# Patient Record
Sex: Female | Born: 1970 | Race: Black or African American | Hispanic: No | Marital: Single | Smoking: Never smoker
Health system: Southern US, Community
[De-identification: ages and names within clinical notes are randomized; demographics above are authoritative.]

## PROBLEM LIST (undated history)

## (undated) DIAGNOSIS — Z8719 Personal history of other diseases of the digestive system: Secondary | ICD-10-CM

## (undated) HISTORY — PX: OTHER SURGICAL HISTORY: SHX169

## (undated) HISTORY — PX: HERNIA REPAIR: SHX51

---

## 1998-08-13 ENCOUNTER — Inpatient Hospital Stay (HOSPITAL_COMMUNITY): Admission: RE | Admit: 1998-08-13 | Discharge: 1998-08-13 | Payer: Self-pay | Admitting: Obstetrics

## 1998-08-13 ENCOUNTER — Encounter: Payer: Self-pay | Admitting: Obstetrics

## 1998-08-19 ENCOUNTER — Encounter (HOSPITAL_COMMUNITY): Admission: RE | Admit: 1998-08-19 | Discharge: 1998-09-12 | Payer: Self-pay | Admitting: *Deleted

## 1998-09-03 ENCOUNTER — Inpatient Hospital Stay (HOSPITAL_COMMUNITY): Admission: AD | Admit: 1998-09-03 | Discharge: 1998-09-03 | Payer: Self-pay | Admitting: Obstetrics

## 1998-09-11 ENCOUNTER — Inpatient Hospital Stay (HOSPITAL_COMMUNITY): Admission: AD | Admit: 1998-09-11 | Discharge: 1998-09-14 | Payer: Self-pay | Admitting: Obstetrics & Gynecology

## 2000-12-06 ENCOUNTER — Emergency Department (HOSPITAL_COMMUNITY): Admission: EM | Admit: 2000-12-06 | Discharge: 2000-12-06 | Payer: Self-pay

## 2014-12-06 ENCOUNTER — Emergency Department (HOSPITAL_COMMUNITY)
Admission: EM | Admit: 2014-12-06 | Discharge: 2014-12-06 | Disposition: A | Discharge status: Home / Self Care | Payer: BLUE CROSS/BLUE SHIELD | Source: Home / Self Care | Attending: Family Medicine | Admitting: Family Medicine

## 2014-12-06 ENCOUNTER — Encounter (HOSPITAL_COMMUNITY): Payer: Self-pay | Admitting: Emergency Medicine

## 2014-12-06 DIAGNOSIS — J3489 Other specified disorders of nose and nasal sinuses: Secondary | ICD-10-CM

## 2014-12-06 MED ORDER — MUPIROCIN CALCIUM 2 % NA OINT: TOPICAL_OINTMENT | NASAL | Status: DC

## 2014-12-06 MED ORDER — DOXYCYCLINE HYCLATE 100 MG PO CAPS: 100.0000 mg | ORAL_CAPSULE | Freq: Two times a day (BID) | ORAL | Status: DC

## 2014-12-06 NOTE — ED Provider Notes (Signed)
CSN: [id number]     Arrival date & time 12/06/14  1841 History   First MD Initiated Contact with Patient 12/06/14 1918     Chief Complaint  Patient presents with  . Facial Swelling   (Consider location/radiation/quality/duration/timing/severity/associated sxs/prior Treatment) HPI Comments: 44 year old female complaining of a painful bump in the left nostril. It started yesterday. She states it is very tender and the pain radiates to the paranasal area of the left side of the face. She attempted to protect this lesion with a needle yesterday without any apparent benefit.   History reviewed. No pertinent past medical history. History reviewed. No pertinent past surgical history. No family history on file. History  Substance Use Topics  . Smoking status: Never Smoker   . Smokeless tobacco: Not on file  . Alcohol Use: No   OB History    No data available     Review of Systems  Constitutional: Negative.   HENT: Negative for congestion, ear discharge, ear pain, postnasal drip, rhinorrhea and sore throat.   Eyes: Negative.   All other systems reviewed and are negative.   Allergies  Review of patient's allergies indicates no known allergies.  Home Medications   Prior to Admission medications   Medication Sig Start Date End Date Taking? Authorizing Provider  doxycycline (VIBRAMYCIN) 100 MG capsule Take 1 capsule (100 mg total) by mouth 2 (two) times daily. 12/06/14   Hayden Rasmussenavid Louvina Cleary, NP  mupirocin nasal ointment (BACTROBAN) 2 % Apply in each nostril daily 12/06/14   Hayden Rasmussenavid Shantai Tiedeman, NP   BP 124/69 mmHg  Pulse 77  Temp(Src) 98.2 F (36.8 C) (Oral)  Resp 16  SpO2 100%  LMP 11/17/2014 Physical Exam  Constitutional: She is oriented to person, place, and time. She appears well-developed and well-nourished. No distress.  HENT:  Mouth/Throat: No oropharyngeal exudate.  Tender papule in the left nostril just opposite the distal ala. No evidence of drainage or bleeding. Positive for tenderness.  I am not able to appreciate any significant facial swelling.  Neck: Normal range of motion. Neck supple.  Couple of anterior nontender cervical nodes.  Cardiovascular: Normal rate.   Pulmonary/Chest: Effort normal. No respiratory distress.  Neurological: She is alert and oriented to person, place, and time. She exhibits normal muscle tone.  Skin: Skin is warm.  Nursing note and vitals reviewed.   ED Course  Procedures (including critical care time) Labs Review Labs Reviewed - No data to display  Imaging Review No results found.   MDM   1. Nostril infection    Mupirocin nasal oint Doxy Topical anesthetic OTC Motrin or APAP    Hayden Rasmussenavid Bryella Diviney, NP 12/06/14 2001

## 2014-12-06 NOTE — Discharge Instructions (Signed)
Use the nasal cream as directed Doxycycline as directed. May use topical pain reliever as discussed Motrin or tylenol for pain.

## 2014-12-06 NOTE — ED Notes (Signed)
Patient c/o nasal swelling and pain onset yesterday. Patient reports she also feels like her lymph nodes are swelling. Patient denies any injury. She thinks maybe a spider bit her.Patient is in NAD.

## 2015-01-29 ENCOUNTER — Emergency Department (INDEPENDENT_AMBULATORY_CARE_PROVIDER_SITE_OTHER)
Admission: EM | Admit: 2015-01-29 | Discharge: 2015-01-29 | Disposition: A | Discharge status: Home / Self Care | Payer: BLUE CROSS/BLUE SHIELD | Source: Home / Self Care | Attending: Family Medicine | Admitting: Family Medicine

## 2015-01-29 ENCOUNTER — Encounter (HOSPITAL_COMMUNITY): Payer: Self-pay | Admitting: Emergency Medicine

## 2015-01-29 DIAGNOSIS — L732 Hidradenitis suppurativa: Secondary | ICD-10-CM | POA: Diagnosis not present

## 2015-01-29 MED ORDER — DOXYCYCLINE HYCLATE 100 MG PO CAPS: 100.0000 mg | ORAL_CAPSULE | Freq: Two times a day (BID) | ORAL | Status: DC

## 2015-01-29 MED ORDER — HYDROCODONE-ACETAMINOPHEN 5-325 MG PO TABS: 2.0000 | ORAL_TABLET | ORAL | Status: DC | PRN

## 2015-01-29 NOTE — Discharge Instructions (Signed)
Hidradenitis Suppurativa, Sweat Gland Abscess Hidradenitis suppurativa is a long lasting (chronic), uncommon disease of the sweat glands. With this, boil-like lumps and scarring develop in the groin, some times under the arms (axillae), and under the breasts. It may also uncommonly occur behind the ears, in the crease of the buttocks, and around the genitals.  CAUSES  The cause is from a blocking of the sweat glands. They then become infected. It may cause drainage and odor. It is not contagious. So it cannot be given to someone else. It most often shows up in puberty (about 10 to 43 years of age). But it may happen much later. It is similar to acne which is a disease of the sweat glands. This condition is slightly more common in African-Americans and women. SYMPTOMS   Hidradenitis usually starts as one or more red, tender, swellings in the groin or under the arms (axilla).  Over a period of hours to days the lesions get larger. They often open to the skin surface, draining clear to yellow-colored fluid.  The infected area heals with scarring. DIAGNOSIS  Your caregiver makes this diagnosis by looking at you. Sometimes cultures (growing germs on plates in the lab) may be taken. This is to see what germ (bacterium) is causing the infection.  TREATMENT   Topical germ killing medicine applied to the skin (antibiotics) are the treatment of choice. Antibiotics taken by mouth (systemic) are sometimes needed when the condition is getting worse or is severe.  Avoid tight-fitting clothing which traps moisture in.  Dirt does not cause hidradenitis and it is not caused by poor hygiene.  Involved areas should be cleaned daily using an antibacterial soap. Some patients find that the liquid form of Lever 2000, applied to the involved areas as a lotion after bathing, can help reduce the odor related to this condition.  Sometimes surgery is needed to drain infected areas or remove scarred tissue. Removal of  large amounts of tissue is used only in severe cases.  Birth control pills may be helpful.  Oral retinoids (vitamin A derivatives) for 6 to 12 months which are effective for acne may also help this condition.  Weight loss will improve but not cure hidradenitis. It is made worse by being overweight. But the condition is not caused by being overweight.  This condition is more common in people who have had acne.  It may become worse under stress. There is no medical cure for hidradenitis. It can be controlled, but not cured. The condition usually continues for years with periods of getting worse and getting better (remission). Document Released: 02/17/2004 Document Revised: 09/27/2011 Document Reviewed: 10/05/2013 Exit[employer] Patient Information 2015 [employer]. This information is not intended to replace advice given to you by your health care provider. Make sure you discuss any questions you have with your health care provider. Abscess An abscess is an infected area that contains a collection of pus and debris.It can occur in almost any part of the body. An abscess is also known as a furuncle or boil. CAUSES  An abscess occurs when tissue gets infected. This can occur from blockage of oil or sweat glands, infection of hair follicles, or a minor injury to the skin. As the body tries to fight the infection, pus collects in the area and creates pressure under the skin. This pressure causes pain. People with weakened immune systems have difficulty fighting infections and get certain abscesses more often.  SYMPTOMS Usually an abscess develops on the skin and   becomes a painful mass that is red, warm, and tender. If the abscess forms under the skin, you may feel a moveable soft area under the skin. Some abscesses break open (rupture) on their own, but most will continue to get worse without care. The infection can spread deeper into the body and eventually into the bloodstream, causing you to feel ill.   DIAGNOSIS  Your caregiver will take your medical history and perform a physical exam. A sample of fluid may also be taken from the abscess to determine what is causing your infection. TREATMENT  Your caregiver may prescribe antibiotic medicines to fight the infection. However, taking antibiotics alone usually does not cure an abscess. Your caregiver may need to make a small cut (incision) in the abscess to drain the pus. In some cases, gauze is packed into the abscess to reduce pain and to continue draining the area. HOME CARE INSTRUCTIONS   Only take over-the-counter or prescription medicines for pain, discomfort, or fever as directed by your caregiver.  If you were prescribed antibiotics, take them as directed. Finish them even if you start to feel better.  If gauze is used, follow your caregiver's directions for changing the gauze.  To avoid spreading the infection:  Keep your draining abscess covered with a bandage.  Wash your hands well.  Do not share personal care items, towels, or whirlpools with others.  Avoid skin contact with others.  Keep your skin and clothes clean around the abscess.  Keep all follow-up appointments as directed by your caregiver. SEEK MEDICAL CARE IF:   You have increased pain, swelling, redness, fluid drainage, or bleeding.  You have muscle aches, chills, or a general ill feeling.  You have a fever. MAKE SURE YOU:   Understand these instructions.  Will watch your condition.  Will get help right away if you are not doing well or get worse. Document Released: 04/14/2005 Document Revised: 01/04/2012 Document Reviewed: 09/17/2011 ExitCare Patient Information 2015 [employer]. This information is not intended to replace advice given to you by your health care provider. Make sure you discuss any questions you have with your health care provider.  

## 2015-01-29 NOTE — ED Notes (Signed)
At bedside for i/d of abscess of left axilla

## 2015-01-29 NOTE — ED Notes (Signed)
Abscess to left axilla, no history of this before.

## 2015-01-29 NOTE — ED Provider Notes (Signed)
CSN: [id number]     Arrival date & time 01/29/15  1709 History   First MD Initiated Contact with Patient 01/29/15 1742     Chief Complaint  Patient presents with  . Abscess   (Consider location/radiation/quality/duration/timing/severity/associated sxs/prior Treatment) Patient is a 44 y.o. female presenting with abscess. The history is provided by the patient. No language interpreter was used.  Abscess Location:  Shoulder/arm Shoulder/arm abscess location:  L axilla Size:  3cm Abscess quality: draining, painful, redness and warmth   Red streaking: no   Progression:  Worsening Pain details:    Quality:  Aching   Severity:  Moderate   Progression:  Worsening Chronicity:  New Relieved by:  Nothing Ineffective treatments:  None tried Pt reports she stuck area this am and drained infection.   Pt has 3 large lumps.   History reviewed. No pertinent past medical history. History reviewed. No pertinent past surgical history. No family history on file. History  Substance Use Topics  . Smoking status: Never Smoker   . Smokeless tobacco: Not on file  . Alcohol Use: No   OB History    No data available     Review of Systems  Skin: Positive for wound.  All other systems reviewed and are negative.   Allergies  Review of patient's allergies indicates no known allergies.  Home Medications   Prior to Admission medications   Medication Sig Start Date End Date Taking? Authorizing Provider  doxycycline (VIBRAMYCIN) 100 MG capsule Take 1 capsule (100 mg total) by mouth 2 (two) times daily. 01/29/15   Elson AreasLeslie K Oluwaferanmi Wain, PA-C  HYDROcodone-acetaminophen (NORCO/VICODIN) 5-325 MG per tablet Take 2 tablets by mouth every 4 (four) hours as needed. 01/29/15   Elson AreasLeslie K Sahaana Weitman, PA-C  mupirocin nasal ointment (BACTROBAN) 2 % Apply in each nostril daily Patient not taking: Reported on 01/29/2015 12/06/14   Hayden Rasmussenavid Mabe, NP   BP 104/64 mmHg  Pulse 89  Temp(Src) 97.9 F (36.6 C) (Oral)  Resp 20  SpO2  98%  LMP 01/14/2015 Physical Exam  Constitutional: She appears well-developed and well-nourished.  HENT:  Head: Normocephalic.  Eyes: Pupils are equal, round, and reactive to light.  Abdominal: Soft.  Musculoskeletal: She exhibits tenderness.  3 swollen areas left axilla  One open draining area,  No fluctuance  Neurological: She is alert.  Skin: Skin is warm.  Psychiatric: She has a normal mood and affect.    ED Course  INCISION AND DRAINAGE Date/Time: 01/29/2015 6:54 PM Performed by: Elson AreasSOFIA, Nakima Fluegge K Authorized by: Bradd CanaryKINDL, JAMES D Consent: Verbal consent not obtained. Risks and benefits: risks, benefits and alternatives were discussed Consent given by: patient Required items: required blood products, implants, devices, and special equipment available Type: abscess Body area: upper extremity Anesthesia: local infiltration Local anesthetic: lidocaine 2% without epinephrine Scalpel size: 11 Incision type: single straight Complexity: simple Drainage: bloody Wound treatment: drain placed Patient tolerance: Patient tolerated the procedure well with no immediate complications Comments: Pt wanted areas opened.   I made small incision in all three areas.  1st area is open ahnd has drained.  No drainage from other areas.   (including critical care time) Labs Review Labs Reviewed - No data to display  Imaging Review No results found.   MDM   1. Hidradenitis suppurativa    avs Hydrocodone Doxycycline    Elson AreasLeslie K Vernella Niznik, PA-C 01/29/15 1856

## 2016-05-13 ENCOUNTER — Ambulatory Visit (HOSPITAL_COMMUNITY)
Admission: EM | Admit: 2016-05-13 | Discharge: 2016-05-13 | Disposition: A | Discharge status: Home / Self Care | Payer: Self-pay | Attending: Internal Medicine | Admitting: Internal Medicine

## 2016-05-13 ENCOUNTER — Encounter (HOSPITAL_COMMUNITY): Payer: Self-pay | Admitting: Emergency Medicine

## 2016-05-13 DIAGNOSIS — S86819A Strain of other muscle(s) and tendon(s) at lower leg level, unspecified leg, initial encounter: Secondary | ICD-10-CM

## 2016-05-13 DIAGNOSIS — M7052 Other bursitis of knee, left knee: Secondary | ICD-10-CM

## 2016-05-13 DIAGNOSIS — M7652 Patellar tendinitis, left knee: Secondary | ICD-10-CM

## 2016-05-13 MED ORDER — NAPROXEN 375 MG PO TABS: 375.0000 mg | ORAL_TABLET | Freq: Two times a day (BID) | ORAL | 0 refills | Status: DC

## 2016-05-13 NOTE — ED Provider Notes (Signed)
CSN: [id number]     Arrival date & time 05/13/16  1951 History   First MD Initiated Contact with Patient 05/13/16 2105     Chief Complaint  Patient presents with  . Leg Pain   (Consider location/radiation/quality/duration/timing/severity/associated sxs/prior Treatment) 45 year old obese female complaining of pain to the anterior knee and to the proximal medial left calf for approximately 8 days. Gradual onset. She states she is on her feet for 14 hours a day. She has 2 jobs that she has not had many days off in the past several weeks. The onset was rather gradual. She has discomfort while working however the greatest discomfort is after sitting for a while or in the morning after sleeping when she has much stiffness in these areas and pain when initiating weightbearing with ambulation. This gradually gets better the more walking that she does. The muscle pain is located to the proximal medial gastrocnemius. The joint pain is located to the anterior tibia along the patellar ligament and bursa. She denies swelling. Denies a period of immobility. Denies injury, blunt trauma. No fevers or chills.      History reviewed. No pertinent past medical history. History reviewed. No pertinent surgical history. History reviewed. No pertinent family history. Social History  Substance Use Topics  . Smoking status: Never Smoker  . Smokeless tobacco: Never Used  . Alcohol use No   OB History    No data available     Review of Systems  Constitutional: Negative.  Negative for activity change, chills and fever.  HENT: Negative.   Respiratory: Negative.   Cardiovascular: Negative.   Musculoskeletal: Positive for myalgias. Negative for back pain, joint swelling and neck pain.       As per HPI  Skin: Negative for color change, pallor and rash.  Neurological: Negative.   All other systems reviewed and are negative.   Allergies  Review of patient's allergies indicates no known allergies.  Home  Medications   Prior to Admission medications   Medication Sig Start Date End Date Taking? Authorizing Provider  naproxen (NAPROSYN) 375 MG tablet Take 1 tablet (375 mg total) by mouth 2 (two) times daily. 05/13/16   Hayden Rasmussen, NP   Meds Ordered and Administered this Visit  Medications - No data to display  BP 121/62 (BP Location: Left Arm)   Pulse 79   Temp 97.9 F (36.6 C) (Oral)   Resp 18   LMP 04/17/2016   SpO2 100%  No data found.   Physical Exam  Constitutional: She is oriented to person, place, and time. She appears well-developed and well-nourished. No distress.  HENT:  Head: Normocephalic and atraumatic.  Eyes: EOM are normal.  Neck: Normal range of motion. Neck supple.  Cardiovascular: Normal rate.   Pulmonary/Chest: Effort normal.  Musculoskeletal: Normal range of motion. She exhibits no edema or deformity.  There is tenderness to the anterior knee along the patellar ligament and tibial tuberosity. There is a small area of swelling to this area as well. Tenderness to the proximal medial calf muscle in a small area. There is no swelling or tension to this area. Palpates as soft. No erythema or increase in warmth. No signs of infection. Full range of motion of the knee. There is no laxity. Negative drawer, negative valgus. No pain with internal/external rotation. No swelling or tenderness to the popliteal fossa. Posterior tibialis muscle is 2+. Normal warmth and color. No skin lesions or cyanosis.  Lymphadenopathy:    She has no cervical adenopathy.  Neurological: She is alert and oriented to person, place, and time. No cranial nerve deficit. Coordination normal.  Skin: Skin is warm and dry. Capillary refill takes less than 2 seconds.  Psychiatric: She has a normal mood and affect.  Nursing note and vitals reviewed.   Urgent Care Course   Clinical Course    Procedures (including critical care time)  Labs Review Labs Reviewed - No data to display  Imaging  Review No results found.   Visual Acuity Review  Right Eye Distance:   Left Eye Distance:   Bilateral Distance:    Right Eye Near:   Left Eye Near:    Bilateral Near:         MDM   1. Patellar tendinitis of left knee   2. Patellar bursitis of left knee   3. Strain of calf muscle, initial encounter    Place ice on the front of the knee and heat to the calf muscle. Perform the stretches for the calf muscles 3-4 times a day as demonstrated, wear the compression stockings while standing and working and walking, take the Naprosyn for inflammation and pain, elevate your legs periodically. For swelling and increased pain in the calf muscle seek medical attention promptly as that might be a sign of a blood clot. Meds ordered this encounter  Medications  . naproxen (NAPROSYN) 375 MG tablet    Sig: Take 1 tablet (375 mg total) by mouth 2 (two) times daily.    Dispense:  20 tablet    Refill:  0    Order Specific Question:   Supervising Provider    Answer:   Oren BeckmannGRUNZ, RYAN B [[id number]]       Gerhard Rappaport, NP 05/13/16 2141

## 2016-05-13 NOTE — ED Triage Notes (Signed)
Pt c/o LLE pain onset 8 days associated swelling, tender, localized fever  Pain increases when walking   Reports she fell 3 months ago at work left knee  A&O x4... NAD

## 2016-05-13 NOTE — Discharge Instructions (Signed)
Place ice on the front of the knee and heat to the calf muscle. Perform the stretches for the calf muscles 3-4 times a day as demonstrated, wear the compression stockings while standing and working and walking, take the Naprosyn for inflammation and pain, elevate your legs periodically. For swelling and increased pain in the calf muscle seek medical attention promptly as that might be a sign of a blood clot.

## 2017-03-07 ENCOUNTER — Encounter (HOSPITAL_COMMUNITY): Payer: Self-pay | Admitting: Emergency Medicine

## 2017-03-07 ENCOUNTER — Ambulatory Visit (HOSPITAL_COMMUNITY)
Admission: EM | Admit: 2017-03-07 | Discharge: 2017-03-07 | Disposition: A | Discharge status: Home / Self Care | Payer: Managed Care, Other (non HMO) | Attending: Family Medicine | Admitting: Family Medicine

## 2017-03-07 DIAGNOSIS — T148XXA Other injury of unspecified body region, initial encounter: Secondary | ICD-10-CM

## 2017-03-07 NOTE — Discharge Instructions (Signed)
You can take ibuprofen 600-800mg  three times a day or naproxen 440mg  two times a day. Follow up with PCP to rule out DVTs given family history of blood clots. If experiencing chest pain, shortness of breath, weakness, dizziness, go to the emergency department for further evaluation.

## 2017-03-07 NOTE — ED Provider Notes (Signed)
[facility]    CSN: [id number] Arrival date & time: 03/07/17  1944     History   Chief Complaint Chief Complaint  Patient presents with  . Arm Pain  . Chest Pain    HPI Brandylee Bertelli is a 46 y.o. female.   46 year old female without significant medical history comes in for 1 week history of multiple symptoms. Patient has had multiple family members diagnosed/treated for "blood clots" in the past few months, few of which had pulmonary embolisms. Patient comes in with complaints of right upper extremity, left upper extremity pain, chest pain, palpitations, tremors, numbness/tingling. When asked what symptoms start first, patient states "my family telling me they have blood clots". She was unable to specifically state what symptoms started first. Symptoms are not constant, and at different locations. Patient specifically denies any chest pain, shortness of breath, trouble breathing, palpitations, numbness/tingling at this visit. She denies diabetes, hyperlipidemia, smoking, long travels. Denies swelling of the leg, calf pain.       History reviewed. No pertinent past medical history.  There are no active problems to display for this patient.   Past Surgical History:  Procedure Laterality Date  . HERNIA REPAIR      OB History    No data available       Home Medications    Prior to Admission medications   Medication Sig Start Date End Date Taking? Authorizing Provider  naproxen (NAPROSYN) 375 MG tablet Take 1 tablet (375 mg total) by mouth 2 (two) times daily. 05/13/16   Hayden Rasmussen, NP    Family History No family history on file.  Social History Social History  Substance Use Topics  . Smoking status: Never Smoker  . Smokeless tobacco: Never Used  . Alcohol use No     Allergies   Patient has no known allergies.   Review of Systems Review of Systems  Reason unable to perform ROS: See HPI as above.     Physical Exam Triage Vital Signs ED  Triage Vitals  Enc Vitals Group     BP 03/07/17 2055 116/61     Pulse Rate 03/07/17 2055 70     Resp 03/07/17 2055 (!) 22     Temp 03/07/17 2055 98.1 F (36.7 C)     Temp Source 03/07/17 2055 Oral     SpO2 03/07/17 2055 100 %     Weight --      Height --      Head Circumference --      Peak Flow --      Pain Score 03/07/17 2052 5     Pain Loc --      Pain Edu? --      Excl. in GC? --    No data found.   Updated Vital Signs BP 116/61 (BP Location: Left Arm)   Pulse 70   Temp 98.1 F (36.7 C) (Oral)   Resp (!) 22   LMP 02/28/2017   SpO2 100%   Visual Acuity Right Eye Distance:   Left Eye Distance:   Bilateral Distance:    Right Eye Near:   Left Eye Near:    Bilateral Near:     Physical Exam  Constitutional: She is oriented to person, place, and time. She appears well-developed and well-nourished. No distress.  HENT:  Head: Normocephalic and atraumatic.  Neck: Neck supple. No spinous process tenderness and no muscular tenderness present. Normal range of motion present.  Cardiovascular: Normal rate, regular rhythm  and normal heart sounds.  Exam reveals no gallop and no friction rub.   No murmur heard. Pulmonary/Chest: Effort normal and breath sounds normal. No respiratory distress. She has no wheezes. She has no rales.  Musculoskeletal:  No swelling of the upper extremities, no palpable cords, increased warmth/erythema noted. No pain on palpation of the shoulders, back, upper extremities. Full ROM of shoulders, elbows, wrists, fingers. Strength normal and equal bilaterally. Sensation intact and equal bilaterally. Radial pulses 2+ and equal. Unable to assess cap refill due to nail polish.   No swelling noted of lower extremity. Negative homans.   Neurological: She is alert and oriented to person, place, and time.     UC Treatments / Results  Labs (all labs ordered are listed, but only abnormal results are displayed) Labs Reviewed - No data to display  EKG  EKG  Interpretation None       Radiology No results found.  Procedures Procedures (including critical care time)  Medications Ordered in UC Medications - No data to display   Initial Impression / Assessment and Plan / UC Course  I have reviewed the triage vital signs and the nursing notes.  Pertinent labs & imaging results that were available during my care of the patient were reviewed by me and considered in my medical decision making (see chart for details).    Discussed with patient no alarming symptoms at this visit.  After discussing findings of visit today, patient states feeling much better with decreased symptoms. Given patient with upper extremity, with movement, will treat for muscle strain. Start naproxen and heat compresses. Patient to follow up with PCP for ultrasound to rule out DVT, blood disorders given multiple family member with blood clots. Return precautions given.   Final Clinical Impressions(s) / UC Diagnoses   Final diagnoses:  Muscle strain    New Prescriptions Discharge Medication List as of 03/07/2017  9:17 PM        Belinda Fisher, PA-C 03/07/17 2304

## 2017-03-07 NOTE — ED Triage Notes (Signed)
Right arm pain, tingling, palpitations, and left arm pain.  Pain is throbbing.  Denies sob Patient has had several family members with blood clots.

## 2017-05-31 ENCOUNTER — Other Ambulatory Visit (HOSPITAL_COMMUNITY): Payer: Self-pay | Admitting: General Surgery

## 2017-06-07 ENCOUNTER — Encounter: Payer: Managed Care, Other (non HMO) | Attending: General Surgery | Admitting: Registered"

## 2017-06-07 DIAGNOSIS — Z713 Dietary counseling and surveillance: Secondary | ICD-10-CM | POA: Diagnosis present

## 2017-06-07 DIAGNOSIS — M25562 Pain in left knee: Secondary | ICD-10-CM | POA: Diagnosis not present

## 2017-06-07 DIAGNOSIS — M25561 Pain in right knee: Secondary | ICD-10-CM | POA: Diagnosis not present

## 2017-06-07 DIAGNOSIS — Z8249 Family history of ischemic heart disease and other diseases of the circulatory system: Secondary | ICD-10-CM | POA: Diagnosis not present

## 2017-06-07 DIAGNOSIS — E669 Obesity, unspecified: Secondary | ICD-10-CM

## 2017-06-07 DIAGNOSIS — M545 Low back pain: Secondary | ICD-10-CM | POA: Diagnosis not present

## 2017-06-07 DIAGNOSIS — Z811 Family history of alcohol abuse and dependence: Secondary | ICD-10-CM | POA: Insufficient documentation

## 2017-06-07 NOTE — Progress Notes (Signed)
Pre-Op Assessment Visit:  Pre-Operative Sleeve Gastrectomy Surgery  Medical Nutrition Therapy:  Appt start time: 3:20  End time:  4:15  Patient was seen on 06/07/2017 for Pre-Operative Nutrition Assessment. Assessment and [facility] coordinator on 06/07/2017.   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 Pt works as Public house managerLPN. Pt states she works 16-20hrs/day; averages 80 hrs/week. Pt states she is currently applying to nursing school at [employer].   Per insurance, pt needs 3 SWL visits prior to surgery. Pt will verify with [facility] if previous visits with dietitian will count towards 3 months of supervised weight loss visits required by insurance.    24 hr Dietary Recall: First Meal: Belvita protein bar Snack: none Second Meal: fish sandwich, soup, fries Snack: none Third Meal: chicken, beans, green beans Snack: popcorn Beverages: coffee, cranberry/grape juice, flavored water  Encouraged to engage in 75 minutes of moderate physical activity including cardiovascular and weight baring weekly  Handouts given during visit include:  . Pre-Op Goals . Bariatric Surgery Protein Shakes . Vitamin and Mineral Recommendations  During the appointment today the following Pre-Op Goals were reviewed with the patient: . Track your food and beverage: MyFitnessPal or Baritastic App . Maintain or lose weight as instructed by your surgeon . Make healthy food choices . Begin to limit portion sizes . Limited concentrated sugars and fried foods . Keep fat/sugar in the single digits per serving on         food labels . Practice CHEWING your food  (aim for 30 chews per bite or until applesauce consistency) . Practice  not drinking 15 minutes before, during, and 30 minutes after each meal/snack . Avoid all carbonated beverages  . Avoid/limit caffeinated beverages  . Avoid all sugar-sweetened beverages . Consume 3 meals per day; eat every 3-5 hours . Make a list of non-food related activities . Aim for 64-100 ounces of FLUID daily  . Aim for at least 60-80 grams of PROTEIN daily . Look for a liquid protein source that contain ?15 g protein and ?5 g carbohydrate  (ex: shakes, drinks, shots) . Physical activity is an important part of a healthy lifestyle so keep it moving! - Verify with [facility] if previous visits with dietitian will count towards 3 months of supervised weight loss visits required by insurance.   Follow diet recommendations listed below Energy and Macronutrient Recommendations: Calories: 1800 Carbohydrate: 200 Protein: 135 Fat: 50  Demonstrated degree of understanding via:  Teach Back   Teaching Method Utilized:  Visual Auditory Hands on  Barriers to learning/adherence to lifestyle change: work-life balance  Patient to call the [facility] to enroll in Pre-Op and Post-Op Nutrition Education when surgery date is scheduled.

## 2017-06-07 NOTE — Patient Instructions (Addendum)
-   Verify with [facility] if previous visits with dietitian will count towards 3 months of supervised weight loss visits required by insurance.

## 2017-06-15 ENCOUNTER — Encounter (HOSPITAL_COMMUNITY): Payer: Self-pay

## 2017-06-15 ENCOUNTER — Other Ambulatory Visit (HOSPITAL_COMMUNITY): Payer: Self-pay | Admitting: General Surgery

## 2017-06-15 ENCOUNTER — Ambulatory Visit (HOSPITAL_COMMUNITY): Payer: Managed Care, Other (non HMO)

## 2017-06-27 ENCOUNTER — Ambulatory Visit: Payer: Managed Care, Other (non HMO)

## 2017-06-30 ENCOUNTER — Ambulatory Visit (HOSPITAL_COMMUNITY)
Admission: RE | Admit: 2017-06-30 | Discharge: 2017-06-30 | Disposition: A | Discharge status: Home / Self Care | Payer: Managed Care, Other (non HMO) | Source: Ambulatory Visit | Attending: General Surgery | Admitting: General Surgery

## 2017-06-30 ENCOUNTER — Encounter (HOSPITAL_COMMUNITY): Payer: Self-pay | Admitting: Radiology

## 2017-06-30 ENCOUNTER — Other Ambulatory Visit: Payer: Self-pay

## 2017-06-30 DIAGNOSIS — Z01810 Encounter for preprocedural cardiovascular examination: Secondary | ICD-10-CM | POA: Insufficient documentation

## 2017-06-30 DIAGNOSIS — Z01818 Encounter for other preprocedural examination: Secondary | ICD-10-CM | POA: Diagnosis present

## 2017-07-04 ENCOUNTER — Ambulatory Visit: Payer: Managed Care, Other (non HMO)

## 2017-07-18 ENCOUNTER — Ambulatory Visit: Payer: Managed Care, Other (non HMO)

## 2017-07-20 ENCOUNTER — Ambulatory Visit (HOSPITAL_COMMUNITY): Payer: Managed Care, Other (non HMO)

## 2017-07-20 ENCOUNTER — Other Ambulatory Visit (HOSPITAL_COMMUNITY): Payer: Managed Care, Other (non HMO)

## 2017-07-25 ENCOUNTER — Encounter: Payer: Managed Care, Other (non HMO) | Attending: General Surgery | Admitting: Skilled Nursing Facility1

## 2017-07-25 ENCOUNTER — Encounter: Payer: Self-pay | Admitting: Skilled Nursing Facility1

## 2017-07-25 DIAGNOSIS — M25561 Pain in right knee: Secondary | ICD-10-CM | POA: Insufficient documentation

## 2017-07-25 DIAGNOSIS — M545 Low back pain: Secondary | ICD-10-CM | POA: Diagnosis not present

## 2017-07-25 DIAGNOSIS — Z811 Family history of alcohol abuse and dependence: Secondary | ICD-10-CM | POA: Insufficient documentation

## 2017-07-25 DIAGNOSIS — E669 Obesity, unspecified: Secondary | ICD-10-CM

## 2017-07-25 DIAGNOSIS — M25562 Pain in left knee: Secondary | ICD-10-CM | POA: Diagnosis not present

## 2017-07-25 DIAGNOSIS — Z713 Dietary counseling and surveillance: Secondary | ICD-10-CM | POA: Insufficient documentation

## 2017-07-25 DIAGNOSIS — Z8249 Family history of ischemic heart disease and other diseases of the circulatory system: Secondary | ICD-10-CM | POA: Insufficient documentation

## 2017-07-25 NOTE — Progress Notes (Signed)
Pre-Operative Nutrition Class:  Appt start time: 8599   End time:  1830.  Patient was seen on 07/25/2017 for Pre-Operative Bariatric Surgery Education at [facility].   Surgery date: 08/08/2017 Surgery type: sleeve Start weight at [facility]: 254.8 Weight today: 261.4  Samples given per MNT protocol. Patient educated on appropriate usage: Procare Multivitamin Lot # [id number] Exp: 05/2019  Bariatric Advantage Calcium  Lot # [id number] Exp: [id number]  Renee Pain Protein Shake Lot # [id number] Exp: may-26-2019  The following the learning objectives were met by the patient during this course:  Identify Pre-Op Dietary Goals and will begin 2 weeks pre-operatively  Identify appropriate sources of fluids and proteins   State protein recommendations and appropriate sources pre and post-operatively  Identify Post-Operative Dietary Goals and will follow for 2 weeks post-operatively  Identify appropriate multivitamin and calcium sources  Describe the need for physical activity post-operatively and will follow MD recommendations  State when to call healthcare provider regarding medication questions or post-operative complications  Handouts given during class include:  Pre-Op Bariatric Surgery Diet Handout  Protein Shake Handout  Post-Op Bariatric Surgery Nutrition Handout  BELT Program Information Flyer  Support Group Information Flyer  [facility] Bariatric Supplements Price List  Follow-Up Plan: [facility] diet advancement per MD.

## 2017-07-27 ENCOUNTER — Ambulatory Visit: Payer: Self-pay | Admitting: General Surgery

## 2017-07-27 NOTE — H&P (Signed)
Shelby Mosley Documented: 07/27/2017 10:08 AM Location: [facility] Patient #: [id number] DOB: 10/15/1970 Single / Language: Shelby Mosley / Race: Black or African American Female  History of Present Illness Shelby Mosley M. Shelby Matsushima MD; 07/27/2017 11:12 AM) The patient is a 47 year old female who presents for a bariatric surgery evaluation. She comes in today for her preoperative appointment. I initially met her in November. Her weight at that time was 258 pounds. She has completed her workup and evaluation for weight loss surgery. She denies any medical changes since she was initially seen. She denies any heartburn or reflux. She denies any shortness of breath, chest pain, dyspnea on exertion, or angina. She is not smoking. Her preoperative upper GI showed a small hiatal hernia without evidence of reflux. Her bariatric evaluation labs were unremarkable except for hemoglobin A1c of 5.7. Her hemoglobin was slightly low at 11.2 but normal MCV.   NOVEMBER 2018 She comes in today to discuss weight loss surgery. She is referred by Dr. Jannette Mosley. She is interested in sleeve gastrectomy. She completed our Neurosurgeon. She is interested in improving her overall health and being able to move around easier. Her sister underwent a sleeve gastrectomy. Despite numerous attempts for sustained weight loss she is been unsuccessful. She has seen a dietitian for several months in a row, phentermine, and Adipex-all without any long-term success  She denies any chest pain, chest pressure, shortness of breath, orthopnea, paroxysmal nocturnal dyspnea, prior blood clots, peripheral edema. She denies any dyspnea on exertion. She denies any heartburn, indigestion reflux. She has a daily bowel movement. She has had an open umbilical hernia. Without mesh. She denies any abdominal pain. She has had a tubal ligation. She is a G4 P4. Her children are ages 41, 61, 27. She size regular menses. She has chronic low  back pain without sciatica. She also has bilateral knee pain as well as heel spurs. She denies any headaches or migraines. She denies any TIAs or amaurosis fugax. She denies any alcohol or drug use or tobacco use. She is in nursing school.   Problem List/Past Medical Shelby Mosley M. Redmond Pulling, MD; 07/27/2017 11:12 AM) BACK PAIN, LUMBOSACRAL (M54.5) BILATERAL CHRONIC KNEE PAIN (M25.561, M25.562) OBESITY, MORBID, BMI 40.0-49.9 (E66.01) PREDIABETES (R73.03) 5.7 (05/2017)  Past Surgical History Shelby Mosley M. Redmond Pulling, MD; 07/27/2017 11:12 AM) No pertinent past surgical history  Diagnostic Studies History Shelby Mosley M. Redmond Pulling, MD; 07/27/2017 11:12 AM) Colonoscopy never Mammogram >3 years ago Pap Smear 1-5 years ago  Allergies (Shelby Mosley, [place]; 07/27/2017 10:09 AM) No Known Drug Allergies [05/26/2017]: Allergies Reconciled  Medication History Shelby Mosley M. Redmond Pulling, MD; 07/27/2017 11:12 AM) Medications Reconciled OxyCODONE HCl (5MG/5ML Solution, 5-10 Oral every four hours, as needed, Taken starting 07/27/2017) Active. No Current Medications (Taken starting 07/27/2017) Protonix (40MG Tablet DR, 1 (one) Oral daily, Taken starting 07/27/2017) Active. Zofran (4MG Tablet, 1 (one) Oral every eight hours, as needed, Taken starting 07/27/2017) Active.  Social History Shelby Mosley M. Redmond Pulling, MD; 07/27/2017 11:12 AM) Caffeine use Coffee. No alcohol use No drug use Tobacco use Never smoker.  Family History Shelby Mosley M. Redmond Pulling, MD; 07/27/2017 11:12 AM) Alcohol Abuse Father. Hypertension Mother.  Pregnancy / Birth History Shelby Mosley M. Redmond Pulling, MD; 07/27/2017 11:12 AM) Age at menarche 12 years. Contraceptive History Contraceptive implant. Gravida 4 Maternal age 28-20 Para 4 Regular periods  Other Problems Shelby Mosley M. Redmond Pulling, MD; 12/23/5991 57:01 AM) Umbilical Hernia Repair     Review of Systems Shelby Mosley M. Shelby Bigos MD; 07/27/2017 11:09 AM) General Not Present- Appetite  Loss, Chills, Fatigue, Fever, Night Sweats,  Weight Gain and Weight Loss. Skin Not Present- Change in Wart/Mole, Dryness, Hives, Jaundice, New Lesions, Non-Healing Wounds, Rash and Ulcer. HEENT Present- Wears glasses/contact lenses. Not Present- Earache, Hearing Loss, Hoarseness, Nose Bleed, Oral Ulcers, Ringing in the Ears, Seasonal Allergies, Sinus Pain, Sore Throat, Visual Disturbances and Yellow Eyes. Respiratory Not Present- Bloody sputum, Chronic Cough, Difficulty Breathing, Snoring and Wheezing. Breast Not Present- Breast Mass, Breast Pain, Nipple Discharge and Skin Changes. Cardiovascular Not Present- Chest Pain, Difficulty Breathing Lying Down, Leg Cramps, Palpitations, Rapid Heart Rate, Shortness of Breath and Swelling of Extremities. Gastrointestinal Not Present- Abdominal Pain, Bloating, Bloody Stool, Change in Bowel Habits, Chronic diarrhea, Constipation, Difficulty Swallowing, Excessive gas, Gets full quickly at meals, Hemorrhoids, Indigestion, Nausea, Rectal Pain and Vomiting. Female Genitourinary Not Present- Frequency, Nocturia, Painful Urination, Pelvic Pain and Urgency. Musculoskeletal Not Present- Back Pain, Joint Pain, Joint Stiffness, Muscle Pain, Muscle Weakness and Swelling of Extremities. Neurological Not Present- Decreased Memory, Fainting, Headaches, Numbness, Seizures, Tingling, Tremor, Trouble walking and Weakness. Psychiatric Not Present- Anxiety, Bipolar, Change in Sleep Pattern, Depression, Fearful and Frequent crying. Endocrine Not Present- Cold Intolerance, Excessive Hunger, Hair Changes, Heat Intolerance, Hot flashes and New Diabetes. Hematology Not Present- Blood Thinners, Easy Bruising, Excessive bleeding, Gland problems, HIV and Persistent Infections.  Vitals (Shelby Mosley; 07/27/2017 10:09 AM) 07/27/2017 10:09 AM Weight: 262.5 lb Height: 67in Body Surface Area: 2.27 m Body Mass Index: 41.11 kg/m  Temp.: 98.58F  Pulse: 92 (Regular)  BP: 128/84 (Sitting, Left Arm,  Standard)      Physical Exam Shelby Mosley M. Shelby Bastedo MD; 07/27/2017 11:09 AM)  General Mental Status-Alert. General Appearance-Consistent with stated age. Hydration-Well hydrated. Voice-Normal.  Head and Neck Head-normocephalic, atraumatic with no lesions or palpable masses. Trachea-midline. Thyroid Gland Characteristics - normal size and consistency.  Eye Eyeball - Bilateral-Extraocular movements intact. Sclera/Conjunctiva - Bilateral-No scleral icterus.  ENMT Mouth and Throat -Note:lips intact.  Note: normal external ears  Chest and Lung Exam Chest and lung exam reveals -quiet, even and easy respiratory effort with no use of accessory muscles and on auscultation, normal breath sounds, no adventitious sounds and normal vocal resonance. Inspection Chest Wall - Normal. Back - normal.  Breast - Did not examine.  Cardiovascular Cardiovascular examination reveals -normal heart sounds, regular rate and rhythm with no murmurs and normal pedal pulses bilaterally.  Abdomen Inspection Inspection of the abdomen reveals - No Hernias. Skin - Scar - Note: small infraumbilical scar. Palpation/Percussion Palpation and Percussion of the abdomen reveal - Soft, Non Tender, No Rebound tenderness, No Rigidity (guarding) and No hepatosplenomegaly. Auscultation Auscultation of the abdomen reveals - Bowel sounds normal.  Peripheral Vascular Upper Extremity Palpation - Pulses bilaterally normal.  Neurologic Neurologic evaluation reveals -alert and oriented x 3 with no impairment of recent or remote memory. Mental Status-Normal.  Neuropsychiatric The patient's mood and affect are described as -normal. Judgment and Insight-insight is appropriate concerning matters relevant to self.  Musculoskeletal Normal Exam - Left-Upper Extremity Strength Normal and Lower Extremity Strength Normal. Normal Exam - Right-Upper Extremity Strength Normal and Lower Extremity  Strength Normal. Note: b/l knee crepitus  Lymphatic Head & Neck  General Head & Neck Lymphatics: Bilateral - Description - Normal. Axillary - Did not examine. Femoral & Inguinal - Did not examine.    Assessment & Plan Shelby Mosley M. Leslee Haueter MD; 07/27/2017 11:10 AM)  OBESITY, MORBID, BMI 40.0-49.9 (E66.01) Impression: The patient meets weight loss surgery criteria. I think the patient would be an  acceptable candidate for Laparoscopic vertical sleeve gastrectomy.  The patient has been approved for weight loss surgery. We rediscussed the typical hospitalization as well as the typical recovery course. She has attended her preoperative education class. We discussed her workup including the findings on her upper GI as well as lab work. All of her questions were asked and answered. She was given her postoperative prescriptions today. I encouraged her to contact us should she have any questions between now and surgery  Current Plans Pt Education - EMW_preopbariatric  BILATERAL CHRONIC KNEE PAIN (M25.561)   BACK PAIN, LUMBOSACRAL (M54.5)   PREDIABETES (R73.03) Story: 5.7 (05/2017) Impression: Discussed the finding of prediabetes during her evaluation labs. Advised her we will monitor this  Shelby Ruff. Redmond Pulling, MD, FACS General, Bariatric, & Minimally Invasive Surgery [facility], [place]

## 2017-07-27 NOTE — H&P (View-Only) (Signed)
Shelby Mosley Documented: 07/27/2017 10:08 AM Location: [facility] Patient #: [id number] DOB: 10/15/1970 Single / Language: Cleophus Mosley / Race: Black or African American Female  History of Present Illness Randall Hiss M. Bexley Mclester MD; 07/27/2017 11:12 AM) The patient is a 47 year old female who presents for a bariatric surgery evaluation. She comes in today for her preoperative appointment. I initially met her in November. Her weight at that time was 258 pounds. She has completed her workup and evaluation for weight loss surgery. She denies any medical changes since she was initially seen. She denies any heartburn or reflux. She denies any shortness of breath, chest pain, dyspnea on exertion, or angina. She is not smoking. Her preoperative upper GI showed a small hiatal hernia without evidence of reflux. Her bariatric evaluation labs were unremarkable except for hemoglobin A1c of 5.7. Her hemoglobin was slightly low at 11.2 but normal MCV.   NOVEMBER 2018 She comes in today to discuss weight loss surgery. She is referred by Dr. Jannette Fogo. She is interested in sleeve gastrectomy. She completed our Neurosurgeon. She is interested in improving her overall health and being able to move around easier. Her sister underwent a sleeve gastrectomy. Despite numerous attempts for sustained weight loss she is been unsuccessful. She has seen a dietitian for several months in a row, phentermine, and Adipex-all without any long-term success  She denies any chest pain, chest pressure, shortness of breath, orthopnea, paroxysmal nocturnal dyspnea, prior blood clots, peripheral edema. She denies any dyspnea on exertion. She denies any heartburn, indigestion reflux. She has a daily bowel movement. She has had an open umbilical hernia. Without mesh. She denies any abdominal pain. She has had a tubal ligation. She is a G4 P4. Her children are ages 41, 61, 27. She size regular menses. She has chronic low  back pain without sciatica. She also has bilateral knee pain as well as heel spurs. She denies any headaches or migraines. She denies any TIAs or amaurosis fugax. She denies any alcohol or drug use or tobacco use. She is in nursing school.   Problem List/Past Medical Randall Hiss M. Redmond Pulling, MD; 07/27/2017 11:12 AM) BACK PAIN, LUMBOSACRAL (M54.5) BILATERAL CHRONIC KNEE PAIN (M25.561, M25.562) OBESITY, MORBID, BMI 40.0-49.9 (E66.01) PREDIABETES (R73.03) 5.7 (05/2017)  Past Surgical History Randall Hiss M. Redmond Pulling, MD; 07/27/2017 11:12 AM) No pertinent past surgical history  Diagnostic Studies History Randall Hiss M. Redmond Pulling, MD; 07/27/2017 11:12 AM) Colonoscopy never Mammogram >3 years ago Pap Smear 1-5 years ago  Allergies (Tanisha A. Owens Shark, [place]; 07/27/2017 10:09 AM) No Known Drug Allergies [05/26/2017]: Allergies Reconciled  Medication History Randall Hiss M. Redmond Pulling, MD; 07/27/2017 11:12 AM) Medications Reconciled OxyCODONE HCl (5MG/5ML Solution, 5-10 Oral every four hours, as needed, Taken starting 07/27/2017) Active. No Current Medications (Taken starting 07/27/2017) Protonix (40MG Tablet DR, 1 (one) Oral daily, Taken starting 07/27/2017) Active. Zofran (4MG Tablet, 1 (one) Oral every eight hours, as needed, Taken starting 07/27/2017) Active.  Social History Randall Hiss M. Redmond Pulling, MD; 07/27/2017 11:12 AM) Caffeine use Coffee. No alcohol use No drug use Tobacco use Never smoker.  Family History Randall Hiss M. Redmond Pulling, MD; 07/27/2017 11:12 AM) Alcohol Abuse Father. Hypertension Mother.  Pregnancy / Birth History Randall Hiss M. Redmond Pulling, MD; 07/27/2017 11:12 AM) Age at menarche 12 years. Contraceptive History Contraceptive implant. Gravida 4 Maternal age 28-20 Para 4 Regular periods  Other Problems Randall Hiss M. Redmond Pulling, MD; 12/23/5991 57:01 AM) Umbilical Hernia Repair     Review of Systems Randall Hiss M. Zarius Furr MD; 07/27/2017 11:09 AM) General Not Present- Appetite  Loss, Chills, Fatigue, Fever, Night Sweats,  Weight Gain and Weight Loss. Skin Not Present- Change in Wart/Mole, Dryness, Hives, Jaundice, New Lesions, Non-Healing Wounds, Rash and Ulcer. HEENT Present- Wears glasses/contact lenses. Not Present- Earache, Hearing Loss, Hoarseness, Nose Bleed, Oral Ulcers, Ringing in the Ears, Seasonal Allergies, Sinus Pain, Sore Throat, Visual Disturbances and Yellow Eyes. Respiratory Not Present- Bloody sputum, Chronic Cough, Difficulty Breathing, Snoring and Wheezing. Breast Not Present- Breast Mass, Breast Pain, Nipple Discharge and Skin Changes. Cardiovascular Not Present- Chest Pain, Difficulty Breathing Lying Down, Leg Cramps, Palpitations, Rapid Heart Rate, Shortness of Breath and Swelling of Extremities. Gastrointestinal Not Present- Abdominal Pain, Bloating, Bloody Stool, Change in Bowel Habits, Chronic diarrhea, Constipation, Difficulty Swallowing, Excessive gas, Gets full quickly at meals, Hemorrhoids, Indigestion, Nausea, Rectal Pain and Vomiting. Female Genitourinary Not Present- Frequency, Nocturia, Painful Urination, Pelvic Pain and Urgency. Musculoskeletal Not Present- Back Pain, Joint Pain, Joint Stiffness, Muscle Pain, Muscle Weakness and Swelling of Extremities. Neurological Not Present- Decreased Memory, Fainting, Headaches, Numbness, Seizures, Tingling, Tremor, Trouble walking and Weakness. Psychiatric Not Present- Anxiety, Bipolar, Change in Sleep Pattern, Depression, Fearful and Frequent crying. Endocrine Not Present- Cold Intolerance, Excessive Hunger, Hair Changes, Heat Intolerance, Hot flashes and New Diabetes. Hematology Not Present- Blood Thinners, Easy Bruising, Excessive bleeding, Gland problems, HIV and Persistent Infections.  Vitals (Tanisha A. Brown RMA; 07/27/2017 10:09 AM) 07/27/2017 10:09 AM Weight: 262.5 lb Height: 67in Body Surface Area: 2.27 m Body Mass Index: 41.11 kg/m  Temp.: 98.24F  Pulse: 92 (Regular)  BP: 128/84 (Sitting, Left Arm,  Standard)      Physical Exam Randall Hiss M. Amier Hoyt MD; 07/27/2017 11:09 AM)  General Mental Status-Alert. General Appearance-Consistent with stated age. Hydration-Well hydrated. Voice-Normal.  Head and Neck Head-normocephalic, atraumatic with no lesions or palpable masses. Trachea-midline. Thyroid Gland Characteristics - normal size and consistency.  Eye Eyeball - Bilateral-Extraocular movements intact. Sclera/Conjunctiva - Bilateral-No scleral icterus.  ENMT Mouth and Throat -Note:lips intact.  Note: normal external ears  Chest and Lung Exam Chest and lung exam reveals -quiet, even and easy respiratory effort with no use of accessory muscles and on auscultation, normal breath sounds, no adventitious sounds and normal vocal resonance. Inspection Chest Wall - Normal. Back - normal.  Breast - Did not examine.  Cardiovascular Cardiovascular examination reveals -normal heart sounds, regular rate and rhythm with no murmurs and normal pedal pulses bilaterally.  Abdomen Inspection Inspection of the abdomen reveals - No Hernias. Skin - Scar - Note: small infraumbilical scar. Palpation/Percussion Palpation and Percussion of the abdomen reveal - Soft, Non Tender, No Rebound tenderness, No Rigidity (guarding) and No hepatosplenomegaly. Auscultation Auscultation of the abdomen reveals - Bowel sounds normal.  Peripheral Vascular Upper Extremity Palpation - Pulses bilaterally normal.  Neurologic Neurologic evaluation reveals -alert and oriented x 3 with no impairment of recent or remote memory. Mental Status-Normal.  Neuropsychiatric The patient's mood and affect are described as -normal. Judgment and Insight-insight is appropriate concerning matters relevant to self.  Musculoskeletal Normal Exam - Left-Upper Extremity Strength Normal and Lower Extremity Strength Normal. Normal Exam - Right-Upper Extremity Strength Normal and Lower Extremity  Strength Normal. Note: b/l knee crepitus  Lymphatic Head & Neck  General Head & Neck Lymphatics: Bilateral - Description - Normal. Axillary - Did not examine. Femoral & Inguinal - Did not examine.    Assessment & Plan Randall Hiss M. Jevon Shells MD; 07/27/2017 11:10 AM)  OBESITY, MORBID, BMI 40.0-49.9 (E66.01) Impression: The patient meets weight loss surgery criteria. I think the patient would be an  acceptable candidate for Laparoscopic vertical sleeve gastrectomy.  The patient has been approved for weight loss surgery. We rediscussed the typical hospitalization as well as the typical recovery course. She has attended her preoperative education class. We discussed her workup including the findings on her upper GI as well as lab work. All of her questions were asked and answered. She was given her postoperative prescriptions today. I encouraged her to contact us should she have any questions between now and surgery  Current Plans Pt Education - EMW_preopbariatric  BILATERAL CHRONIC KNEE PAIN (M25.561)   BACK PAIN, LUMBOSACRAL (M54.5)   PREDIABETES (R73.03) Story: 5.7 (05/2017) Impression: Discussed the finding of prediabetes during her evaluation labs. Advised her we will monitor this  Leighton Ruff. Redmond Pulling, MD, FACS General, Bariatric, & Minimally Invasive Surgery [facility], [place]

## 2017-08-01 NOTE — Patient Instructions (Signed)
Shelby Mosley  08/01/2017   Your procedure is scheduled on:08-08-17   Report to [facility] Main  Entrance  Follow signs to Short Stay on first floor at 530 AM   Call this number if you have problems the morning of surgery [phone]    Remember: Do not eat food :After Midnight.   NO SOLID FOOD AFTER MIDNIGHT THE NIGHT PRIOR TO SRRGERY. NOTHING BY MOUTH EXCEPT CLEAR LIQUIDS UNTIL 3 HOURS PRIOR TO SCHEULED SURGERY. PLEASE FINISH ENSURE DRINK PER SURGEON ORDER 3 HOURS PRIOR TO SCHEDULED SURGERY TIME WHICH NEEDS TO BE COMPLETED AT ___0415_________.   Take these medicines the morning of surgery with A SIP OF WATER: NONE                                You may not have any metal on your body including hair pins and              piercings  Do not wear jewelry, make-up, lotions, powders or perfumes, deodorant             Do not wear nail polish.  Do not shave  48 hours prior to surgery.              Men may shave face and neck.   Do not bring valuables to the hospital. [place] IS NOT             RESPONSIBLE   FOR VALUABLES.  Contacts, dentures or bridgework may not be worn into surgery.  Leave suitcase in the car. After surgery it may be brought to your room.                  Please read over the following fact sheets you were given: _____________________________________________________________________           [facility] - Preparing for Surgery Before surgery, you can play an important role.  Because skin is not sterile, your skin needs to be as free of germs as possible.  You can reduce the number of germs on your skin by washing with CHG (chlorahexidine gluconate) soap before surgery.  CHG is an antiseptic cleaner which kills germs and bonds with the skin to continue killing germs even after washing. Please DO NOT use if you have an allergy to CHG or antibacterial soaps.  If your skin becomes reddened/irritated stop using the CHG and inform your nurse when  you arrive at Short Stay. Do not shave (including legs and underarms) for at least 48 hours prior to the first CHG shower.  You may shave your face/neck. Please follow these instructions carefully:  1.  Shower with CHG Soap the night before surgery and the  morning of Surgery.  2.  If you choose to wash your hair, wash your hair first as usual with your  normal  shampoo.  3.  After you shampoo, rinse your hair and body thoroughly to remove the  shampoo.                           4.  Use CHG as you would any other liquid soap.  You can apply chg directly  to the skin and wash  Gently with a scrungie or clean washcloth.  5.  Apply the CHG Soap to your body ONLY FROM THE NECK DOWN.   Do not use on face/ open                           Wound or open sores. Avoid contact with eyes, ears mouth and genitals (private parts).                       Wash face,  Genitals (private parts) with your normal soap.             6.  Wash thoroughly, paying special attention to the area where your surgery  will be performed.  7.  Thoroughly rinse your body with warm water from the neck down.  8.  DO NOT shower/wash with your normal soap after using and rinsing off  the CHG Soap.                9.  Pat yourself dry with a clean towel.            10.  Wear clean pajamas.            11.  Place clean sheets on your bed the night of your first shower and do not  sleep with pets. Day of Surgery : Do not apply any lotions/deodorants the morning of surgery.  Please wear clean clothes to the hospital/surgery center.  FAILURE TO FOLLOW THESE INSTRUCTIONS MAY RESULT IN THE CANCELLATION OF YOUR SURGERY PATIENT SIGNATURE_________________________________  NURSE SIGNATURE__________________________________  ________________________________________________________________________   WHAT IS A BLOOD TRANSFUSION? Blood Transfusion Information  A transfusion is the replacement of blood or some of its parts.  Blood is made up of multiple cells which provide different functions.  Red blood cells carry oxygen and are used for blood loss replacement.  White blood cells fight against infection.  Platelets control bleeding.  Plasma helps clot blood.  Other blood products are available for specialized needs, such as hemophilia or other clotting disorders. BEFORE THE TRANSFUSION  Who gives blood for transfusions?   Healthy volunteers who are fully evaluated to make sure their blood is safe. This is blood bank blood. Transfusion therapy is the safest it has ever been in the practice of medicine. Before blood is taken from a donor, a complete history is taken to make sure that person has no history of diseases nor engages in risky social behavior (examples are intravenous drug use or sexual activity with multiple partners). The donor's travel history is screened to minimize risk of transmitting infections, such as malaria. The donated blood is tested for signs of infectious diseases, such as HIV and hepatitis. The blood is then tested to be sure it is compatible with you in order to minimize the chance of a transfusion reaction. If you or a relative donates blood, this is often done in anticipation of surgery and is not appropriate for emergency situations. It takes many days to process the donated blood. RISKS AND COMPLICATIONS Although transfusion therapy is very safe and saves many lives, the main dangers of transfusion include:   Getting an infectious disease.  Developing a transfusion reaction. This is an allergic reaction to something in the blood you were given. Every precaution is taken to prevent this. The decision to have a blood transfusion has been considered carefully by your caregiver before blood is given. Blood is not given unless the benefits  outweigh the risks. AFTER THE TRANSFUSION  Right after receiving a blood transfusion, you will usually feel much better and more energetic. This is  especially true if your red blood cells have gotten low (anemic). The transfusion raises the level of the red blood cells which carry oxygen, and this usually causes an energy increase.  The nurse administering the transfusion will monitor you carefully for complications. HOME CARE INSTRUCTIONS  No special instructions are needed after a transfusion. You may find your energy is better. Speak with your caregiver about any limitations on activity for underlying diseases you may have. SEEK MEDICAL CARE IF:   Your condition is not improving after your transfusion.  You develop redness or irritation at the intravenous (IV) site. SEEK IMMEDIATE MEDICAL CARE IF:  Any of the following symptoms occur over the next 12 hours:  Shaking chills.  You have a temperature by mouth above 102 F (38.9 C), not controlled by medicine.  Chest, back, or muscle pain.  People around you feel you are not acting correctly or are confused.  Shortness of breath or difficulty breathing.  Dizziness and fainting.  You get a rash or develop hives.  You have a decrease in urine output.  Your urine turns a dark color or changes to pink, red, or brown. Any of the following symptoms occur over the next 10 days:  You have a temperature by mouth above 102 F (38.9 C), not controlled by medicine.  Shortness of breath.  Weakness after normal activity.  The white part of the eye turns yellow (jaundice).  You have a decrease in the amount of urine or are urinating less often.  Your urine turns a dark color or changes to pink, red, or brown. Document Released: 07/02/2000 Document Revised: 09/27/2011 Document Reviewed: 02/19/2008 [facility] Patient Information 2014 [place], [place].  _______________________________________________________________________

## 2017-08-02 ENCOUNTER — Encounter (HOSPITAL_COMMUNITY): Payer: Self-pay

## 2017-08-02 ENCOUNTER — Encounter (HOSPITAL_COMMUNITY)
Admission: RE | Admit: 2017-08-02 | Discharge: 2017-08-02 | Disposition: A | Discharge status: Home / Self Care | Payer: Managed Care, Other (non HMO) | Source: Ambulatory Visit | Attending: General Surgery | Admitting: General Surgery

## 2017-08-02 ENCOUNTER — Other Ambulatory Visit: Payer: Self-pay

## 2017-08-02 DIAGNOSIS — Z01812 Encounter for preprocedural laboratory examination: Secondary | ICD-10-CM | POA: Insufficient documentation

## 2017-08-02 HISTORY — DX: Personal history of other diseases of the digestive system: Z87.19

## 2017-08-02 LAB — COMPREHENSIVE METABOLIC PANEL
ALT: 15 U/L (ref 14–54)
ANION GAP: 6 (ref 5–15)
AST: 19 U/L (ref 15–41)
Albumin: 3.6 g/dL (ref 3.5–5.0)
Alkaline Phosphatase: 65 U/L (ref 38–126)
BUN: 16 mg/dL (ref 6–20)
CHLORIDE: 106 mmol/L (ref 101–111)
CO2: 26 mmol/L (ref 22–32)
Calcium: 8.7 mg/dL — ABNORMAL LOW (ref 8.9–10.3)
Creatinine, Ser: 0.81 mg/dL (ref 0.44–1.00)
GFR calc Af Amer: >60 mL/min (ref >60)
GFR calc non Af Amer: >60 mL/min (ref >60)
Glucose, Bld: 90 mg/dL (ref 65–99)
POTASSIUM: 4 mmol/L (ref 3.5–5.1)
SODIUM: 138 mmol/L (ref 135–145)
Total Bilirubin: 0.6 mg/dL (ref 0.3–1.2)
Total Protein: 7.5 g/dL (ref 6.5–8.1)

## 2017-08-02 LAB — CBC WITH DIFFERENTIAL/PLATELET
Basophils Absolute: 0 10*3/uL (ref 0.0–0.1)
Basophils Relative: 0 %
EOS ABS: 0.1 10*3/uL (ref 0.0–0.7)
EOS PCT: 2 %
HCT: 36.3 % (ref 36.0–46.0)
Hemoglobin: 11.4 g/dL — ABNORMAL LOW (ref 12.0–15.0)
LYMPHS ABS: 1.4 10*3/uL (ref 0.7–4.0)
Lymphocytes Relative: 24 %
MCH: 27.7 pg (ref 26.0–34.0)
MCHC: 31.4 g/dL (ref 30.0–36.0)
MCV: 88.3 fL (ref 78.0–100.0)
MONOS PCT: 11 %
Monocytes Absolute: 0.7 10*3/uL (ref 0.1–1.0)
Neutro Abs: 3.7 10*3/uL (ref 1.7–7.7)
Neutrophils Relative %: 63 %
PLATELETS: 261 10*3/uL (ref 150–400)
RBC: 4.11 MIL/uL (ref 3.87–5.11)
RDW: 13 % (ref 11.5–15.5)
WBC: 5.9 10*3/uL (ref 4.0–10.5)

## 2017-08-02 LAB — ABO/RH: ABO/RH(D): O POS

## 2017-08-02 NOTE — Progress Notes (Signed)
ekg 06-30-17 epic  cxr 06-30-17 epic

## 2017-08-08 ENCOUNTER — Encounter (HOSPITAL_COMMUNITY): Payer: Self-pay

## 2017-08-08 ENCOUNTER — Encounter (HOSPITAL_COMMUNITY)
Admission: RE | Disposition: A | Discharge status: Home / Self Care | Payer: Self-pay | Source: Ambulatory Visit | Attending: General Surgery

## 2017-08-08 ENCOUNTER — Inpatient Hospital Stay (HOSPITAL_COMMUNITY): Payer: Managed Care, Other (non HMO) | Admitting: Certified Registered Nurse Anesthetist

## 2017-08-08 ENCOUNTER — Other Ambulatory Visit: Payer: Self-pay

## 2017-08-08 ENCOUNTER — Inpatient Hospital Stay (HOSPITAL_COMMUNITY)
Admission: RE | Admit: 2017-08-08 | Discharge: 2017-08-10 | DRG: 621 | Disposition: A | Discharge status: Home / Self Care | Payer: Managed Care, Other (non HMO) | Source: Ambulatory Visit | Attending: General Surgery | Admitting: General Surgery

## 2017-08-08 DIAGNOSIS — M545 Low back pain, unspecified: Secondary | ICD-10-CM | POA: Diagnosis present

## 2017-08-08 DIAGNOSIS — Z6841 Body Mass Index (BMI) 40.0 and over, adult: Secondary | ICD-10-CM | POA: Diagnosis not present

## 2017-08-08 DIAGNOSIS — M25561 Pain in right knee: Secondary | ICD-10-CM | POA: Diagnosis present

## 2017-08-08 DIAGNOSIS — Z9884 Bariatric surgery status: Secondary | ICD-10-CM

## 2017-08-08 DIAGNOSIS — R7303 Prediabetes: Secondary | ICD-10-CM | POA: Diagnosis present

## 2017-08-08 DIAGNOSIS — M25562 Pain in left knee: Secondary | ICD-10-CM | POA: Diagnosis present

## 2017-08-08 DIAGNOSIS — K449 Diaphragmatic hernia without obstruction or gangrene: Secondary | ICD-10-CM | POA: Diagnosis present

## 2017-08-08 DIAGNOSIS — G8929 Other chronic pain: Secondary | ICD-10-CM | POA: Diagnosis present

## 2017-08-08 HISTORY — PX: HIATAL HERNIA REPAIR: SHX195

## 2017-08-08 HISTORY — PX: LAPAROSCOPIC GASTRIC SLEEVE RESECTION: SHX5895

## 2017-08-08 LAB — TYPE AND SCREEN
ABO/RH(D): O POS
ANTIBODY SCREEN: NEGATIVE

## 2017-08-08 LAB — HEMOGLOBIN AND HEMATOCRIT, BLOOD
HEMATOCRIT: 34.7 % — AB (ref 36.0–46.0)
Hemoglobin: 10.9 g/dL — ABNORMAL LOW (ref 12.0–15.0)

## 2017-08-08 LAB — PREGNANCY, URINE: Preg Test, Ur: NEGATIVE

## 2017-08-08 SURGERY — GASTRECTOMY, SLEEVE, LAPAROSCOPIC
Anesthesia: General

## 2017-08-08 MED ORDER — ACETAMINOPHEN 500 MG PO TABS
1000.0000 mg | ORAL_TABLET | ORAL | Status: AC
  Administered 2017-08-08: 1000 mg via ORAL
  Filled 2017-08-08: qty 2

## 2017-08-08 MED ORDER — PROPOFOL 10 MG/ML IV BOLUS
INTRAVENOUS | Status: DC | PRN
  Administered 2017-08-08: 100 mg via INTRAVENOUS
  Administered 2017-08-08: 200 mg via INTRAVENOUS

## 2017-08-08 MED ORDER — KCL IN DEXTROSE-NACL 20-5-0.45 MEQ/L-%-% IV SOLN
INTRAVENOUS | Status: DC
  Administered 2017-08-08: 1000 mL via INTRAVENOUS
  Administered 2017-08-08 – 2017-08-09 (×2): via INTRAVENOUS
  Administered 2017-08-09 – 2017-08-10 (×3): 1000 mL via INTRAVENOUS
  Filled 2017-08-08 (×7): qty 1000

## 2017-08-08 MED ORDER — SODIUM CHLORIDE 0.9 % IJ SOLN
INTRAMUSCULAR | Status: DC | PRN
  Administered 2017-08-08: 50 mL

## 2017-08-08 MED ORDER — MORPHINE SULFATE (PF) 2 MG/ML IV SOLN
1.0000 mg | INTRAVENOUS | Status: DC | PRN
  Administered 2017-08-08: 1 mg via INTRAVENOUS
  Administered 2017-08-08: 2 mg via INTRAVENOUS
  Filled 2017-08-08 (×3): qty 1

## 2017-08-08 MED ORDER — BUPIVACAINE LIPOSOME 1.3 % IJ SUSP
20.0000 mL | Freq: Once | INTRAMUSCULAR | Status: AC
  Administered 2017-08-08: 20 mL
  Filled 2017-08-08: qty 20

## 2017-08-08 MED ORDER — HEPARIN SODIUM (PORCINE) 5000 UNIT/ML IJ SOLN
5000.0000 [IU] | INTRAMUSCULAR | Status: AC
  Administered 2017-08-08: 5000 [IU] via SUBCUTANEOUS
  Filled 2017-08-08: qty 1

## 2017-08-08 MED ORDER — LACTATED RINGERS IR SOLN
Status: DC | PRN
  Administered 2017-08-08: 1

## 2017-08-08 MED ORDER — FENTANYL CITRATE (PF) 100 MCG/2ML IJ SOLN
INTRAMUSCULAR | Status: DC | PRN
  Administered 2017-08-08: 100 ug via INTRAVENOUS
  Administered 2017-08-08: 50 ug via INTRAVENOUS

## 2017-08-08 MED ORDER — MIDAZOLAM HCL 2 MG/2ML IJ SOLN
INTRAMUSCULAR | Status: AC
  Filled 2017-08-08: qty 2

## 2017-08-08 MED ORDER — ONDANSETRON HCL 4 MG/2ML IJ SOLN
INTRAMUSCULAR | Status: DC | PRN
  Administered 2017-08-08: 4 mg via INTRAVENOUS

## 2017-08-08 MED ORDER — CEFOTETAN DISODIUM-DEXTROSE 2-2.08 GM-%(50ML) IV SOLR
2.0000 g | INTRAVENOUS | Status: AC
  Administered 2017-08-08: 2 g via INTRAVENOUS
  Filled 2017-08-08: qty 50

## 2017-08-08 MED ORDER — GABAPENTIN 300 MG PO CAPS
300.0000 mg | ORAL_CAPSULE | ORAL | Status: AC
  Administered 2017-08-08: 300 mg via ORAL
  Filled 2017-08-08: qty 1

## 2017-08-08 MED ORDER — EPHEDRINE SULFATE-NACL 50-0.9 MG/10ML-% IV SOSY
PREFILLED_SYRINGE | INTRAVENOUS | Status: DC | PRN
  Administered 2017-08-08: 10 mg via INTRAVENOUS
  Administered 2017-08-08: 20 mg via INTRAVENOUS

## 2017-08-08 MED ORDER — ONDANSETRON HCL 4 MG/2ML IJ SOLN: 4.0000 mg | Freq: Four times a day (QID) | INTRAMUSCULAR | Status: DC | PRN

## 2017-08-08 MED ORDER — LACTATED RINGERS IV SOLN
INTRAVENOUS | Status: DC | PRN
  Administered 2017-08-08: 07:00:00 via INTRAVENOUS

## 2017-08-08 MED ORDER — ONDANSETRON HCL 4 MG/2ML IJ SOLN
INTRAMUSCULAR | Status: AC
  Filled 2017-08-08: qty 2

## 2017-08-08 MED ORDER — SODIUM CHLORIDE 0.9 % IJ SOLN
INTRAMUSCULAR | Status: AC
  Filled 2017-08-08: qty 50

## 2017-08-08 MED ORDER — CHLORHEXIDINE GLUCONATE 4 % EX LIQD: 60.0000 mL | Freq: Once | CUTANEOUS | Status: DC

## 2017-08-08 MED ORDER — HYDROMORPHONE HCL 1 MG/ML IJ SOLN
INTRAMUSCULAR | Status: AC
  Filled 2017-08-08: qty 1

## 2017-08-08 MED ORDER — LACTATED RINGERS IV SOLN: INTRAVENOUS | Status: DC

## 2017-08-08 MED ORDER — ONDANSETRON HCL 4 MG/2ML IJ SOLN
4.0000 mg | Freq: Four times a day (QID) | INTRAMUSCULAR | Status: DC | PRN
  Administered 2017-08-08 – 2017-08-09 (×3): 4 mg via INTRAVENOUS
  Filled 2017-08-08 (×3): qty 2

## 2017-08-08 MED ORDER — LIDOCAINE 2% (20 MG/ML) 5 ML SYRINGE
INTRAMUSCULAR | Status: DC | PRN
  Administered 2017-08-08: 80 mg via INTRAVENOUS

## 2017-08-08 MED ORDER — OXYCODONE HCL 5 MG/5ML PO SOLN
5.0000 mg | Freq: Once | ORAL | Status: DC | PRN
  Filled 2017-08-08: qty 5

## 2017-08-08 MED ORDER — SIMETHICONE 80 MG PO CHEW
80.0000 mg | CHEWABLE_TABLET | Freq: Four times a day (QID) | ORAL | Status: DC | PRN
  Administered 2017-08-09 – 2017-08-10 (×2): 80 mg via ORAL
  Filled 2017-08-08 (×2): qty 1

## 2017-08-08 MED ORDER — LIDOCAINE 2% (20 MG/ML) 5 ML SYRINGE
INTRAMUSCULAR | Status: DC | PRN
  Administered 2017-08-08: 1.5 mg/kg/h via INTRAVENOUS

## 2017-08-08 MED ORDER — GABAPENTIN 250 MG/5ML PO SOLN
200.0000 mg | Freq: Two times a day (BID) | ORAL | Status: DC
  Administered 2017-08-09 – 2017-08-10 (×2): 200 mg via ORAL
  Filled 2017-08-08 (×5): qty 4

## 2017-08-08 MED ORDER — PROMETHAZINE HCL 25 MG/ML IJ SOLN: 12.5000 mg | Freq: Four times a day (QID) | INTRAMUSCULAR | Status: DC | PRN

## 2017-08-08 MED ORDER — LIDOCAINE 2% (20 MG/ML) 5 ML SYRINGE
INTRAMUSCULAR | Status: AC
  Filled 2017-08-08: qty 5

## 2017-08-08 MED ORDER — PANTOPRAZOLE SODIUM 40 MG IV SOLR
40.0000 mg | Freq: Every day | INTRAVENOUS | Status: DC
  Administered 2017-08-08 – 2017-08-09 (×2): 40 mg via INTRAVENOUS
  Filled 2017-08-08 (×2): qty 40

## 2017-08-08 MED ORDER — LIDOCAINE HCL 2 % IJ SOLN
INTRAMUSCULAR | Status: AC
  Filled 2017-08-08: qty 20

## 2017-08-08 MED ORDER — OXYCODONE HCL 5 MG PO TABS: 5.0000 mg | ORAL_TABLET | Freq: Once | ORAL | Status: DC | PRN

## 2017-08-08 MED ORDER — KETAMINE HCL 10 MG/ML IJ SOLN
INTRAMUSCULAR | Status: AC
  Filled 2017-08-08: qty 1

## 2017-08-08 MED ORDER — FENTANYL CITRATE (PF) 250 MCG/5ML IJ SOLN
INTRAMUSCULAR | Status: AC
  Filled 2017-08-08: qty 5

## 2017-08-08 MED ORDER — SCOPOLAMINE 1 MG/3DAYS TD PT72
1.0000 | MEDICATED_PATCH | TRANSDERMAL | Status: DC
  Administered 2017-08-08: 1.5 mg via TRANSDERMAL
  Filled 2017-08-08: qty 1

## 2017-08-08 MED ORDER — HYDROMORPHONE HCL 1 MG/ML IJ SOLN
0.2500 mg | INTRAMUSCULAR | Status: DC | PRN
  Administered 2017-08-08: 0.5 mg via INTRAVENOUS

## 2017-08-08 MED ORDER — DIPHENHYDRAMINE HCL 50 MG/ML IJ SOLN: 12.5000 mg | Freq: Three times a day (TID) | INTRAMUSCULAR | Status: DC | PRN

## 2017-08-08 MED ORDER — ENOXAPARIN SODIUM 30 MG/0.3ML ~~LOC~~ SOLN
30.0000 mg | Freq: Two times a day (BID) | SUBCUTANEOUS | Status: DC
  Administered 2017-08-08 – 2017-08-10 (×4): 30 mg via SUBCUTANEOUS
  Filled 2017-08-08 (×4): qty 0.3

## 2017-08-08 MED ORDER — PROPOFOL 10 MG/ML IV BOLUS
INTRAVENOUS | Status: AC
  Filled 2017-08-08: qty 20

## 2017-08-08 MED ORDER — MIDAZOLAM HCL 5 MG/5ML IJ SOLN
INTRAMUSCULAR | Status: DC | PRN
  Administered 2017-08-08: 2 mg via INTRAVENOUS

## 2017-08-08 MED ORDER — APREPITANT 40 MG PO CAPS
40.0000 mg | ORAL_CAPSULE | ORAL | Status: AC
  Administered 2017-08-08: 40 mg via ORAL
  Filled 2017-08-08: qty 1

## 2017-08-08 MED ORDER — PREMIER PROTEIN SHAKE
2.0000 [oz_av] | ORAL | Status: DC
  Administered 2017-08-09 – 2017-08-10 (×7): 2 [oz_av] via ORAL

## 2017-08-08 MED ORDER — ROCURONIUM BROMIDE 50 MG/5ML IV SOSY
PREFILLED_SYRINGE | INTRAVENOUS | Status: AC
  Filled 2017-08-08: qty 5

## 2017-08-08 MED ORDER — ACETAMINOPHEN 160 MG/5ML PO SOLN
650.0000 mg | Freq: Four times a day (QID) | ORAL | Status: DC
  Administered 2017-08-09 – 2017-08-10 (×5): 650 mg via ORAL
  Filled 2017-08-08 (×6): qty 20.3

## 2017-08-08 MED ORDER — OXYCODONE HCL 5 MG/5ML PO SOLN
5.0000 mg | ORAL | Status: DC | PRN
  Administered 2017-08-09 (×2): 5 mg via ORAL
  Filled 2017-08-08 (×2): qty 5

## 2017-08-08 MED ORDER — PROMETHAZINE HCL 25 MG/ML IJ SOLN
12.5000 mg | Freq: Four times a day (QID) | INTRAMUSCULAR | Status: DC | PRN
  Administered 2017-08-09: 12.5 mg via INTRAVENOUS
  Filled 2017-08-08: qty 1

## 2017-08-08 MED ORDER — SUGAMMADEX SODIUM 500 MG/5ML IV SOLN
INTRAVENOUS | Status: AC
  Filled 2017-08-08: qty 5

## 2017-08-08 MED ORDER — ROCURONIUM BROMIDE 50 MG/5ML IV SOSY
PREFILLED_SYRINGE | INTRAVENOUS | Status: DC | PRN
  Administered 2017-08-08: 10 mg via INTRAVENOUS
  Administered 2017-08-08: 50 mg via INTRAVENOUS

## 2017-08-08 MED ORDER — SUCCINYLCHOLINE CHLORIDE 200 MG/10ML IV SOSY
PREFILLED_SYRINGE | INTRAVENOUS | Status: AC
  Filled 2017-08-08: qty 10

## 2017-08-08 MED ORDER — KETAMINE HCL 10 MG/ML IJ SOLN
INTRAMUSCULAR | Status: DC | PRN
  Administered 2017-08-08: 30 mg via INTRAVENOUS

## 2017-08-08 MED ORDER — DEXAMETHASONE SODIUM PHOSPHATE 4 MG/ML IJ SOLN
4.0000 mg | INTRAMUSCULAR | Status: AC
  Administered 2017-08-08: 4 mg via INTRAVENOUS

## 2017-08-08 SURGICAL SUPPLY — 66 items
APPLICATOR COTTON TIP 6IN STRL (MISCELLANEOUS) IMPLANT
APPLIER CLIP ROT 10 11.4 M/L (STAPLE)
APPLIER CLIP ROT 13.4 12 LRG (CLIP)
BANDAGE ADH SHEER 1  50/CT (GAUZE/BANDAGES/DRESSINGS) ×24 IMPLANT
BENZOIN TINCTURE PRP APPL 2/3 (GAUZE/BANDAGES/DRESSINGS) ×4 IMPLANT
BLADE SURG SZ11 CARB STEEL (BLADE) ×4 IMPLANT
CABLE HIGH FREQUENCY MONO STRZ (ELECTRODE) ×4 IMPLANT
CHLORAPREP W/TINT 26ML (MISCELLANEOUS) ×8 IMPLANT
CLIP APPLIE ROT 10 11.4 M/L (STAPLE) IMPLANT
CLIP APPLIE ROT 13.4 12 LRG (CLIP) IMPLANT
CLOSURE WOUND 1/2 X4 (GAUZE/BANDAGES/DRESSINGS) ×2
DECANTER SPIKE VIAL GLASS SM (MISCELLANEOUS) ×4 IMPLANT
DERMABOND ADVANCED (GAUZE/BANDAGES/DRESSINGS) ×2
DERMABOND ADVANCED .7 DNX12 (GAUZE/BANDAGES/DRESSINGS) ×2 IMPLANT
DEVICE SUT QUICK LOAD TK 5 (STAPLE) ×9 IMPLANT
DEVICE SUT TI-KNOT TK 5X26 (MISCELLANEOUS) ×6 IMPLANT
DEVICE TI KNOT TK5 (MISCELLANEOUS) ×2
DRAPE UTILITY XL STRL (DRAPES) ×8 IMPLANT
ELECT L-HOOK LAP 45CM DISP (ELECTROSURGICAL)
ELECT PENCIL ROCKER SW 15FT (MISCELLANEOUS) IMPLANT
ELECT REM PT RETURN 15FT ADLT (MISCELLANEOUS) ×4 IMPLANT
ELECTRODE L-HOOK LAP 45CM DISP (ELECTROSURGICAL) IMPLANT
GAUZE SPONGE 2X2 8PLY STRL LF (GAUZE/BANDAGES/DRESSINGS) IMPLANT
GAUZE SPONGE 4X4 12PLY STRL (GAUZE/BANDAGES/DRESSINGS) IMPLANT
GLOVE BIO SURGEON STRL SZ7.5 (GLOVE) ×4 IMPLANT
GLOVE INDICATOR 8.0 STRL GRN (GLOVE) ×4 IMPLANT
GOWN STRL REUS W/TWL XL LVL3 (GOWN DISPOSABLE) ×12 IMPLANT
GRASPER SUT TROCAR 14GX15 (MISCELLANEOUS) IMPLANT
HOVERMATT SINGLE USE (MISCELLANEOUS) ×4 IMPLANT
KIT BASIN OR (CUSTOM PROCEDURE TRAY) ×4 IMPLANT
MARKER SKIN DUAL TIP RULER LAB (MISCELLANEOUS) ×4 IMPLANT
NEEDLE SPNL 22GX3.5 QUINCKE BK (NEEDLE) ×4 IMPLANT
PACK UNIVERSAL I (CUSTOM PROCEDURE TRAY) ×4 IMPLANT
QUICK LOAD TK 5 (STAPLE) ×3
RELOAD STAPLER BLUE 60MM (STAPLE) ×4 IMPLANT
RELOAD STAPLER GOLD 60MM (STAPLE) ×2 IMPLANT
RELOAD STAPLER GREEN 60MM (STAPLE) ×4 IMPLANT
SCISSORS METZENBAUM CVD 44CM (INSTRUMENTS) IMPLANT
SEALANT SURGICAL APPL DUAL CAN (MISCELLANEOUS) IMPLANT
SET IRRIG TUBING LAPAROSCOPIC (IRRIGATION / IRRIGATOR) ×4 IMPLANT
SHEARS HARMONIC ACE PLUS 45CM (MISCELLANEOUS) ×4 IMPLANT
SLEEVE GASTRECTOMY 40FR VISIGI (MISCELLANEOUS) ×4 IMPLANT
SLEEVE XCEL OPT CAN 5 100 (ENDOMECHANICALS) ×12 IMPLANT
SOLUTION ANTI FOG 6CC (MISCELLANEOUS) ×4 IMPLANT
SPONGE GAUZE 2X2 STER 10/PKG (GAUZE/BANDAGES/DRESSINGS)
SPONGE LAP 18X18 X RAY DECT (DISPOSABLE) ×4 IMPLANT
STAPLER ECHELON BIOABSB 60 FLE (MISCELLANEOUS) ×20 IMPLANT
STAPLER ECHELON LONG 60 440 (INSTRUMENTS) IMPLANT
STAPLER RELOAD BLUE 60MM (STAPLE) ×8
STAPLER RELOAD GOLD 60MM (STAPLE) ×4
STAPLER RELOAD GREEN 60MM (STAPLE) ×8
STRIP CLOSURE SKIN 1/2X4 (GAUZE/BANDAGES/DRESSINGS) ×6 IMPLANT
SUT MNCRL AB 4-0 PS2 18 (SUTURE) ×4 IMPLANT
SUT SURGIDAC NAB ES-9 0 48 120 (SUTURE) ×8 IMPLANT
SUT VICRYL 0 TIES 12 18 (SUTURE) ×4 IMPLANT
SYR 20CC LL (SYRINGE) ×4 IMPLANT
SYR 50ML LL SCALE MARK (SYRINGE) ×4 IMPLANT
TOWEL OR 17X26 10 PK STRL BLUE (TOWEL DISPOSABLE) ×4 IMPLANT
TOWEL OR NON WOVEN STRL DISP B (DISPOSABLE) ×4 IMPLANT
TRAY FOLEY W/METER SILVER 16FR (SET/KITS/TRAYS/PACK) IMPLANT
TROCAR BLADELESS 15MM (ENDOMECHANICALS) ×4 IMPLANT
TROCAR BLADELESS OPT 5 100 (ENDOMECHANICALS) ×4 IMPLANT
TUBING CONNECTING 10 (TUBING) ×3 IMPLANT
TUBING CONNECTING 10' (TUBING) ×1
TUBING ENDO SMARTCAP (MISCELLANEOUS) ×4 IMPLANT
TUBING INSUF HEATED (TUBING) ×4 IMPLANT

## 2017-08-08 NOTE — Interval H&P Note (Signed)
History and Physical Interval Note:  08/08/2017 7:08 AM  Shelby Mosley  has presented today for surgery, with the diagnosis of MORBID OBESITY  The various methods of treatment have been discussed with the patient and family. After consideration of risks, benefits and other options for treatment, the patient has consented to  Procedure(s): LAPAROSCOPIC GASTRIC SLEEVE RESECTION WITH UPPER ENDO (N/A) as a surgical intervention .  The patient's history has been reviewed, patient examined, no change in status, stable for surgery.  I have reviewed the patient's chart and labs.  Questions were answered to the patient's satisfaction.    Mary SellaEric M. Andrey CampanileWilson, MD, FACS General, Bariatric, & Minimally Invasive Surgery [facility]   Gaynelle AduEric Chistian Kasler

## 2017-08-08 NOTE — Discharge Instructions (Signed)
° ° ° °GASTRIC BYPASS/SLEEVE ° Home Care Instructions ° ° These instructions are to help you care for yourself when you go home. ° °Call: If you have any problems. °• Call [phone] and ask for the surgeon on call °• If you need immediate help, come to the ER at [place].  °• Tell the ER staff that you are a new post-op gastric bypass or gastric sleeve patient °  °Signs and symptoms to report: • Severe vomiting or nausea °o If you cannot keep down clear liquids for longer than 1 day, call your surgeon  °• Abdominal pain that does not get better after taking your pain medication °• Fever over 100.4° F with chills °• Heart beating over 100 beats a minute °• Shortness of breath at rest °• Chest pain °•  Redness, swelling, drainage, or foul odor at incision (surgical) sites °•  If your incisions open or pull apart °• Swelling or pain in calf (lower leg) °• Diarrhea (Loose bowel movements that happen often), frequent watery, uncontrolled bowel movements °• Constipation, (no bowel movements for 3 days) if this happens: Pick one °o Milk of Magnesia, 2 tablespoons by mouth, 3 times a day for 2 days if needed °o Stop taking Milk of Magnesia once you have a bowel movement °o Call your doctor if constipation continues °Or °o Miralax  (instead of Milk of Magnesia) following the label instructions °o Stop taking Miralax once you have a bowel movement °o Call your doctor if constipation continues °• Anything you think is not normal °  °Normal side effects after surgery: • Unable to sleep at night or unable to focus °• Irritability or moody °• Being tearful (crying) or depressed °These are common complaints, possibly related to your anesthesia medications that put you to sleep, stress of surgery, and change in lifestyle.  This usually goes away a few weeks after surgery.  If these feelings continue, call your primary care doctor. °  °Wound Care: You may have surgical glue, steri-strips, or staples over your incisions after  surgery °• Surgical glue:  Looks like a clear film over your incisions and will wear off a little at a time °• Steri-strips: Strips of tape over your incisions. You may notice a yellowish color on the skin under the steri-strips. This is used to make the   steri-strips stick better. Do not pull the steri-strips off - let them fall off °• Staples: Staples may be removed before you leave the hospital °o If you go home with staples, call [facility], [phone] at for an appointment with your surgeon’s nurse to have staples removed 10 days after surgery. °• Showering: You may shower two (2) days after your surgery unless your surgeon tells you differently °o Wash gently around incisions with warm soapy water, rinse well, and gently pat dry  °o No tub baths until staples are removed, steri-strips fall off or glue is gone.  °  °Medications: • Medications should be liquid or crushed if larger than the size of a dime °• Extended release pills (medication that release a little bit at a time through the day) should NOT be crushed or cut. (examples include XL, ER, DR, SR) °• Depending on the size and number of medications you take, you may need to space (take a few throughout the day)/change the time you take your medications so that you do not over-fill your pouch (smaller stomach) °• Make sure you follow-up with your primary care doctor to   make medication changes needed during rapid weight loss and life-style changes °• If you have diabetes, follow up with the doctor that orders your diabetes medication(s) within one week after surgery and check your blood sugar regularly. °• Do not drive while taking prescription pain medication  °• It is ok to take Tylenol by the bottle instructions with your pain medicine or instead of your pain medicine as needed.  DO NOT TAKE NSAIDS (EXAMPLES OF NSAIDS:  IBUPROFREN/ NAPROXEN)  °Diet:                    First 2 Weeks ° You will see the dietician t about two (2) weeks  after your surgery. The dietician will increase the types of foods you can eat if you are handling liquids well: °• If you have severe vomiting or nausea and cannot keep down clear liquids lasting longer than 1 day, call your surgeon @ ([phone]) °Protein Shake °• Drink at least 2 ounces of shake 5-6 times per day °• Each serving of protein shakes (usually 8 - 12 ounces) should have: °o 15 grams of protein  °o And no more than 5 grams of carbohydrate  °• Goal for protein each day: °o Men = 80 grams per day °o Women = 60 grams per day °• Protein powder may be added to fluids such as non-fat milk or Lactaid milk or unsweetened Soy/Almond milk (limit to 35 grams added protein powder per serving) ° °Hydration °• Slowly increase the amount of water and other clear liquids as tolerated (See Acceptable Fluids) °• Slowly increase the amount of protein shake as tolerated  °•  Sip fluids slowly and throughout the day.  Do not use straws. °• May use sugar substitutes in small amounts (no more than 6 - 8 packets per day; i.e. Splenda) ° °Fluid Goal °• The first goal is to drink at least 8 ounces of protein shake/drink per day (or as directed by the nutritionist); some examples of protein shakes are Syntrax Nectar, Adkins Advantage, EAS Edge HP, and Unjury. See handout from pre-op Bariatric Education Class: °o Slowly increase the amount of protein shake you drink as tolerated °o You may find it easier to slowly sip shakes throughout the day °o It is important to get your proteins in first °• Your fluid goal is to drink 64 - 100 ounces of fluid daily °o It may take a few weeks to build up to this °• 32 oz (or more) should be clear liquids  °And  °• 32 oz (or more) should be full liquids (see below for examples) °• Liquids should not contain sugar, caffeine, or carbonation ° °Clear Liquids: °• Water or Sugar-free flavored water (i.e. Fruit H2O, Propel) °• Decaffeinated coffee or tea (sugar-free) °• Crystal Lite, Wyler’s Lite,  Minute Maid Lite °• Sugar-free Jell-O °• Bouillon or broth °• Sugar-free Popsicle:   *Less than 20 calories each; Limit 1 per day ° °Full Liquids: °Protein Shakes/Drinks + 2 choices per day of other full liquids °• Full liquids must be: °o No More Than 15 grams of Carbs per serving  °o No More Than 3 grams of Fat per serving °• Strained low-fat cream soup (except Cream of Potato or Tomato) °• Non-Fat milk °• Fat-free Lactaid Milk °• Unsweetened Soy Or Unsweetened Almond Milk °• Low Sugar yogurt (Dannon Lite & Fit, Greek yogurt; Oikos Triple Zero; Chobani Simply 100; Yoplait 100 calorie Greek - No Fruit on the Bottom) ° °  °Vitamins   and Minerals • Start 1 day after surgery unless otherwise directed by your surgeon °• 2 Chewable Bariatric Specific Multivitamin / Multimineral Supplement with iron (Example: Bariatric Advantage Multi EA) °• Chewable Calcium with Vitamin D-3 °(Example: 3 Chewable Calcium Plus 600 with Vitamin D-3) °o Take 500 mg three (3) times a day for a total of 1500 mg each day °o Do not take all 3 doses of calcium at one time as it may cause constipation, and you can only absorb 500 mg  at a time  °o Do not mix multivitamins containing iron with calcium supplements; take 2 hours apart °• Menstruating women and those with a history of anemia (a blood disease that causes weakness) may need extra iron °o Talk with your doctor to see if you need more iron °• Do not stop taking or change any vitamins or minerals until you talk to your dietitian or surgeon °• Your Dietitian and/or surgeon must approve all vitamin and mineral supplements °  °Activity and Exercise: Limit your physical activity as instructed by your doctor.  It is important to continue walking at home.  During this time, use these guidelines: °• Do not lift anything greater than ten (10) pounds for at least two (2) weeks °• Do not go back to work or drive until your surgeon says you can °• You may have sex when you feel comfortable  °o It is  VERY important for female patients to use a reliable birth control method; fertility often increases after surgery  °o All hormonal birth control will be ineffective for 30 days after surgery due to medications given during surgery a barrier method must be used. °o Do not get pregnant for at least 18 months °• Start exercising as soon as your doctor tells you that you can °o Make sure your doctor approves any physical activity °• Start with a simple walking program °• Walk 5-15 minutes each day, 7 days per week.  °• Slowly increase until you are walking 30-45 minutes per day °Consider joining our BELT program. [phone] or email [email or web address].[email or web address] °  °Special Instructions Things to remember: °• Use your CPAP when sleeping if this applies to you ° °• [facility] has two free Bariatric Surgery Support Groups that meet monthly °o The 3rd Thursday of each month, 6 pm, [place] [facility]  °o The 2nd Friday of each month, 11:45 am in the private dining room in the basement of [facility] °• It is very important to keep all follow up appointments with your surgeon, dietitian, primary care physician, and behavioral health practitioner °• Routine follow up schedule with your surgeon include appointments at 2-3 weeks, 6-8 weeks, 6 months, and 1 year at a minimum.  Your surgeon may request to see you more often.   °o After the first year, please follow up with your bariatric surgeon and dietitian at least once a year in order to maintain best weight loss results °[facility]: [phone] °[facility]: [phone] °Bariatric Nurse Coordinator: [phone] °  °   Reviewed and Endorsed  °by [place] Patient Education Committee, June, 2016 °Edits Approved: Aug, 2018 ° ° ° °

## 2017-08-08 NOTE — Progress Notes (Signed)
Patient ambulated, vital signs stable. Patient has not voided, given an ounce of ice chips.

## 2017-08-08 NOTE — Anesthesia Preprocedure Evaluation (Signed)
Anesthesia Evaluation  Patient identified by MRN, date of birth, ID band Patient awake    Reviewed: Allergy & Precautions, H&P , NPO status , Patient's Chart, lab work & pertinent test results  Airway Mallampati: II   Neck ROM: full    Dental   Pulmonary neg pulmonary ROS,    breath sounds clear to auscultation       Cardiovascular negative cardio ROS   Rhythm:regular Rate:Normal     Neuro/Psych    GI/Hepatic hiatal hernia,   Endo/Other  Morbid obesity  Renal/GU      Musculoskeletal   Abdominal   Peds  Hematology   Anesthesia Other Findings   Reproductive/Obstetrics                             Anesthesia Physical Anesthesia Plan  ASA: II  Anesthesia Plan: General   Post-op Pain Management:    Induction: Intravenous  PONV Risk Score and Plan: 3 and Ondansetron, Dexamethasone, Midazolam and Treatment may vary due to age or medical condition  Airway Management Planned: Oral ETT  Additional Equipment:   Intra-op Plan:   Post-operative Plan: Extubation in OR  Informed Consent: I have reviewed the patients History and Physical, chart, labs and discussed the procedure including the risks, benefits and alternatives for the proposed anesthesia with the patient or authorized representative who has indicated his/her understanding and acceptance.     Plan Discussed with: CRNA, Anesthesiologist and Surgeon  Anesthesia Plan Comments:         Anesthesia Quick Evaluation

## 2017-08-08 NOTE — Transfer of Care (Signed)
Immediate Anesthesia Transfer of Care Note  Patient: Shelby Mosley  Procedure(s) Performed: LAPAROSCOPIC GASTRIC SLEEVE RESECTION WITH UPPER ENDO (N/A ) HERNIA REPAIR HIATAL  Patient Location: PACU  Anesthesia Type:General  Level of Consciousness: drowsy and patient cooperative  Airway & Oxygen Therapy: Patient Spontanous Breathing and Patient connected to face mask oxygen  Post-op Assessment: Report given to RN and Post -op Vital signs reviewed and stable  Post vital signs: Reviewed and stable  Last Vitals:  Vitals:   08/08/17 0601  BP: 129/82  Pulse: 82  Resp: 16  Temp: 36.7 C  SpO2: 96%    Last Pain:  Vitals:   08/08/17 0601  TempSrc: Oral         Complications: No apparent anesthesia complications

## 2017-08-08 NOTE — Op Note (Signed)
Name:  Shelby Mosley Manigo MRN: [id number] Date of Surgery: 08/08/2017  Preop Diagnosis:  Morbid Obesity  Postop Diagnosis:  Morbid Obesity (Weight - 262, BMI - 41.1), S/P Gastric Sleeve resection  Procedure:  Upper endoscopy  (Intraoperative)  Surgeon:  Ovidio Kinavid Classie Weng, M.D.  Anesthesia:  GET  Indications for procedure: Shelby Mosley Thaker is a 47 y.o. female whose primary care physician is Shelbie AmmonsHaque, Imran P, MD and has completed a gastric sleeve resection today for weight loss by Dr. Andrey CampanileWilson.  I am doing an intraoperative upper endoscopy to evaluate the gastric pouch after the sleeve gastrectomy.  Operative Note: The patient is under general anesthesia.  Dr. Andrey CampanileWilson is laparoscoping the patient while I do an upper endoscopy to evaluate the stomach pouch.  With the patient intubated, I passed the Pentax upper endoscope without difficulty down the esophagus.  The esophagus was unremarkable.  The esophago-gastric junction was at 42 cm.    The mucosa of the stomach looked viable and the staple line was intact without bleeding.  I advanced the scope to the pylorus, but did not go through it.  While I insufflated the stomach pouch with air, Dr. Andrey CampanileWilson  flooded the upper abdomen with saline to put the gastric pouch under saline.  There was no bubbling or evidence of a leak.  There was no evidence of narrowing of the pouch and the gastric sleeve looked tubular.  The scope was then withdrawn.  The esophagus was unremarkable and the patient tolerated the endoscopy without difficulty.  Ovidio Kinavid Marlon Vonruden, MD, [facility] Pager: [phone] Office phone:  [phone]

## 2017-08-08 NOTE — Anesthesia Procedure Notes (Signed)
Procedure Name: Intubation Date/Time: 08/08/2017 8:33 AM Performed by: Montel Clock, CRNA Pre-anesthesia Checklist: Patient identified, Emergency Drugs available, Suction available, Patient being monitored and Timeout performed Patient Re-evaluated:Patient Re-evaluated prior to induction Oxygen Delivery Method: Circle system utilized Preoxygenation: Pre-oxygenation with 100% oxygen Induction Type: IV induction Ventilation: Mask ventilation without difficulty and Oral airway inserted - appropriate to patient size Laryngoscope Size: Mac and 3 Grade View: Grade II Tube type: Oral Tube size: 7.5 mm Number of attempts: 2 Airway Equipment and Method: Stylet Placement Confirmation: ETT inserted through vocal cords under direct vision,  positive ETCO2 and breath sounds checked- equal and bilateral Secured at: 21 cm Tube secured with: Tape Dental Injury: Teeth and Oropharynx as per pre-operative assessment  Comments: 1st attempt by CRNA, arytenoids visible unable to pass tube, difficulty with head positioning r/t hair bun. 2nd attempt by MD, ETT passed through cords.

## 2017-08-08 NOTE — Progress Notes (Signed)
Discussed post op day goals with patient and significant other Alinda Moneyony including ambulation, IS, diet progression, pain, and nausea control.  Questions answered.

## 2017-08-08 NOTE — Anesthesia Postprocedure Evaluation (Signed)
Anesthesia Post Note  Patient: Chanda BusingKoretta Perl  Procedure(s) Performed: LAPAROSCOPIC GASTRIC SLEEVE RESECTION WITH UPPER ENDO (N/A ) HERNIA REPAIR HIATAL     Patient location during evaluation: PACU Anesthesia Type: General Level of consciousness: awake and alert Pain management: pain level controlled Vital Signs Assessment: post-procedure vital signs reviewed and stable Respiratory status: spontaneous breathing, nonlabored ventilation, respiratory function stable and patient connected to nasal cannula oxygen Cardiovascular status: blood pressure returned to baseline and stable Postop Assessment: no apparent nausea or vomiting Anesthetic complications: no    Last Vitals:  Vitals:   08/08/17 1100 08/08/17 1125  BP: 133/78 137/80  Pulse: 62 60  Resp: 15 16  Temp: 36.5 C 36.5 C  SpO2: 100% 100%    Last Pain:  Vitals:   08/08/17 1125  TempSrc:   PainSc: 5                  Ceara Wrightson S

## 2017-08-08 NOTE — Op Note (Signed)
08/08/2017 Chanda BusingKoretta Lewallen Nov 25, 1970 [id number]   PRE-OPERATIVE DIAGNOSIS:     Morbid obesity BMI 40   Lumbosacral pain, chronic   Bilateral chronic knee pain   Hiatal hernia   Prediabetes '  POST-OPERATIVE DIAGNOSIS:  same  PROCEDURE:  Procedure(s): LAPAROSCOPIC SLEEVE GASTRECTOMY WITH HIATAL HERNIA REPAIR UPPER GI ENDOSCOPY  SURGEON:  Surgeon(s): Atilano InaEric M Emalynn Clewis, MD FACS FASMBS  ASSISTANTS: Ovidio Kinavid Newman MD FACS  ANESTHESIA:   general  DRAINS: none   BOUGIE: 40 fr ViSiGi  LOCAL MEDICATIONS USED:   Exparel  EBL: minimal  SPECIMEN:  Source of Specimen:  Greater curvature of stomach  DISPOSITION OF SPECIMEN:  PATHOLOGY  COUNTS:  YES  INDICATION FOR PROCEDURE: This is a very pleasant 47 y.o.-year-old morbidly obese female who has had unsuccessful attempts for sustained weight loss. The patient presents today for a planned laparoscopic sleeve gastrectomy with upper endoscopy. We have discussed the risk and benefits of the procedure extensively preoperatively. Please see my separate notes.  PROCEDURE: After obtaining informed consent and receiving 5000 units of subcutaneous heparin, the patient was brought to the operating room at [facility] and placed supine on the operating room table. General endotracheal anesthesia was established. Sequential compression devices were placed. A orogastric tube was placed. The patient's abdomen was prepped and draped in the usual standard surgical fashion. The patient received preoperative IV antibiotic. A surgical timeout was performed. ERAS protocol used.   Access to the abdomen was achieved using a 5 mm 0 laparoscope thru a 5 mm trocar In the left upper Quadrant 2 fingerbreadths below the left subcostal margin using the Optiview technique. Pneumoperitoneum was smoothly established up to 15 mm of mercury. The laparoscope was advanced and the abdominal cavity was surveilled. The patient was then placed in reverse Trendelenburg.   A 5 mm  trocar was placed slightly above and to the left of the umbilicus under direct visualization.  She had some upper midline omental adhesions that we were able to work around. The [facility] liver retractor was placed under the left lobe of the liver through a 5 mm trocar incision site in the subxiphoid position. A 5 mm trocar was placed in the lateral right upper quadrant along with a 15 mm trocar in the mid right abdomen. A final 5 mm trocar was placed in the lateral LUQ.  All under direct visualization after exparel had been infiltrated in bilateral lateral upper abdominal walls as a TAP block.  The stomach was inspected. It was completely decompressed and the orogastric tube was removed.  There is a small anterior dimple that was obviously visible. Her preop UGI showed a small sliding hiatal hernia. The calibration tube was placed in the oropharynx and guided down into the stomach by the CRNA. 10 mL of air was insufflated into the calibration balloon. The calibration tubing was then gently pulled back by the CRNA and it slid past the GE junction. At this point the calibration tubing was desufflated and pulled back into the esophagus. This confirmed my suspicion of a clinically significant hiatal hernia. The gastrohepatic ligament was incised with harmonic scalpel. The right crus was identified. We identified the crossing fat along the right crus. The adipose tissue just above this area was incised with harmonic scalpel. I then bluntly dissected out this area and identified the left crus. There was evidence of a hiatal hernia. I then mobilized the esophagus. The left and right crus were further mobilized with blunt dissection. I was then able to reapproximate  the left and right crus with 0 Ethibond using an Endostitch suture device and securing it with a titanium tyknot. I placed a second suture in a similar fashion. We then had the CRNA readvanced the calibration tubing back into the stomach. 10 mL of air was  insufflated into the calibration tube balloon. The calibration tube was then gently pulled back and there was resistance at the GE junction. The tube did not slide back up into the esophagus. At this point the calibration tubing was deflated and removed from the patient's body.   We identified the pylorus and measured 6 cm proximal to the pylorus and identified an area of where we would start taking down the short gastric vessels. Harmonic scalpel was used to take down the short gastric vessels along the greater curvature of the stomach. We were able to enter the lesser sac. We continued to march along the greater curvature of the stomach taking down the short gastrics. As we approached the gastrosplenic ligament we took care in this area not to injure the spleen. We were able to take down the entire gastrosplenic ligament. We then mobilized the fundus away from the left crus of diaphragm. There were not any significant posterior gastric avascular attachments. This left the stomach completely mobilized. No vessels had been taken down along the lesser curvature of the stomach.  We then reidentified the pylorus. A 40Fr ViSiGi was then placed in the oropharynx and advanced down into the stomach and placed in the distal antrum and positioned along the lesser curvature. It was placed under suction which secured the 40Fr ViSiGi in place along the lesser curve. Then using the Ethicon echelon 60 mm stapler with a green load with Seamguard, I placed a stapler along the antrum approximately 5 cm from the pylorus. The stapler was angled so that there is ample room at the angularis incisura. I then fired the first staple load after inspecting it posteriorly to ensure adequate space both anteriorly and posteriorly. At this point I still was not completely past the angularis so with another green load with Seamguard, I placed the stapler in position just inside the prior stapleline. We then rotated the stomach to insure that  there was adequate anteriorly as well as posteriorly. The stapler was then fired.  At this point I started using 60 mm gold load staple cartridge x1 with Seamguard. The echelon stapler was then repositioned with a 60 mm blue load with Seamguard and we continued to march up along the ViSiGi. My assistant was holding traction along the greater curvature stomach along the cauterized short gastric vessels ensuring that the stomach was symmetrically retracted. Prior to each firing of the staple, we rotated the stomach to ensure that there is adequate stomach left.  As we approached the fundus, I used 60 mm blue cartridge with Seamguard aiming  lateral to the GE junction after mobilizing some of the esophageal fat pad.  The sleeve was inspected. There is no evidence of cork screw. The staple line appeared hemostatic. The CRNA inflated the ViSiGi to the green zone and the upper abdomen was flooded with saline. There were no bubbles. The sleeve was decompressed and the ViSiGi removed. My assistant scrubbed out and performed an upper endoscopy. The sleeve easily distended with air and the scope was easily advanced to the pylorus. There is no evidence of internal bleeding or cork screwing. There was no narrowing at the angularis. There is no evidence of bubbles. Please see his operative note  for further details. The gastric sleeve was decompressed and the endoscope was removed.  The greater curvature the stomach was grasped with a laparoscopic grasper and removed from the 15 mm trocar site.  The liver retractor was removed. I then closed the 15 mm trocar site with 1 interrupted 0 Vicryl sutures through the fascia using the endoclose. The closure was viewed laparoscopically and it was airtight. Remaining Exparel was then infiltrated in the preperitoneal spaces around the trocar sites. Pneumoperitoneum was released. All trocar sites were closed with a 4-0 Monocryl in a subcuticular fashion followed by the application of  benzoin, steri-strips, and bandaids. The patient was extubated and taken to the recovery room in stable condition. All needle, instrument, and sponge counts were correct x2. There are no immediate complications  (2) 60 mm green with Seamguard (1) 60 mm gold with seamguard (2) 60 mm blue with  seamguard  PLAN OF CARE: Admit to inpatient   PATIENT DISPOSITION:  PACU - hemodynamically stable.   Delay start of Pharmacological VTE agent (>24hrs) due to surgical blood loss or risk of bleeding:  no  Mary Sella. Andrey Campanile, MD, FACS FASMBS General, Bariatric, & Minimally Invasive Surgery [facility], [place]

## 2017-08-09 ENCOUNTER — Encounter (HOSPITAL_COMMUNITY): Payer: Self-pay | Admitting: General Surgery

## 2017-08-09 LAB — CBC WITH DIFFERENTIAL/PLATELET
Basophils Absolute: 0 10*3/uL (ref 0.0–0.1)
Basophils Relative: 0 %
EOS ABS: 0 10*3/uL (ref 0.0–0.7)
EOS PCT: 0 %
HCT: 33.3 % — ABNORMAL LOW (ref 36.0–46.0)
HEMOGLOBIN: 10.4 g/dL — AB (ref 12.0–15.0)
LYMPHS ABS: 1.5 10*3/uL (ref 0.7–4.0)
Lymphocytes Relative: 14 %
MCH: 27.5 pg (ref 26.0–34.0)
MCHC: 31.2 g/dL (ref 30.0–36.0)
MCV: 88.1 fL (ref 78.0–100.0)
Monocytes Absolute: 1 10*3/uL (ref 0.1–1.0)
Monocytes Relative: 9 %
Neutro Abs: 8.3 10*3/uL — ABNORMAL HIGH (ref 1.7–7.7)
Neutrophils Relative %: 77 %
PLATELETS: 249 10*3/uL (ref 150–400)
RBC: 3.78 MIL/uL — ABNORMAL LOW (ref 3.87–5.11)
RDW: 12.7 % (ref 11.5–15.5)
WBC: 10.8 10*3/uL — ABNORMAL HIGH (ref 4.0–10.5)

## 2017-08-09 LAB — COMPREHENSIVE METABOLIC PANEL
ALK PHOS: 60 U/L (ref 38–126)
ALT: 18 U/L (ref 14–54)
AST: 22 U/L (ref 15–41)
Albumin: 3 g/dL — ABNORMAL LOW (ref 3.5–5.0)
Anion gap: 4 — ABNORMAL LOW (ref 5–15)
BUN: 10 mg/dL (ref 6–20)
CALCIUM: 8.4 mg/dL — AB (ref 8.9–10.3)
CO2: 25 mmol/L (ref 22–32)
CREATININE: 0.78 mg/dL (ref 0.44–1.00)
Chloride: 107 mmol/L (ref 101–111)
GFR calc non Af Amer: >60 mL/min (ref >60)
Glucose, Bld: 140 mg/dL — ABNORMAL HIGH (ref 65–99)
Potassium: 3.6 mmol/L (ref 3.5–5.1)
SODIUM: 136 mmol/L (ref 135–145)
Total Bilirubin: 0.4 mg/dL (ref 0.3–1.2)
Total Protein: 6.7 g/dL (ref 6.5–8.1)

## 2017-08-09 MED ORDER — KETOROLAC TROMETHAMINE 30 MG/ML IJ SOLN
30.0000 mg | Freq: Three times a day (TID) | INTRAMUSCULAR | Status: AC
  Administered 2017-08-09 (×2): 30 mg via INTRAVENOUS
  Filled 2017-08-09 (×2): qty 1

## 2017-08-09 NOTE — Progress Notes (Signed)
Patient ID: Shelby BusingKoretta Mosley, female   DOB: Mar 08, 1971, 47 y.o.   MRN: [id number]   Progress Note: Metabolic and Bariatric Surgery Service   Chief Complaint/Subjective: Having some pain in abd but walked, took in 12 oz water but hasn't started shake yet Had to be I/o last night but husband states she has voided on her own since then  Objective: Vital signs in last 24 hours: Temp:  [97.7 F (36.5 C)-99.3 F (37.4 C)] 99.3 F (37.4 C) (01/22 0536) Pulse Rate:  [60-76] 76 (01/22 1038) Resp:  [15-18] 18 (01/22 1038) BP: (113-137)/(53-89) 135/78 (01/22 1038) SpO2:  [94 %-100 %] 99 % (01/22 1038) Last BM Date: 08/07/17  Intake/Output from previous day: 01/21 0701 - 01/22 0700 In: 3380 [P.O.:180; I.V.:3200] Out: 1375 [Urine:1350; Blood:25] Intake/Output this shift: Total I/O In: -  Out: 500 [Urine:500]  Lungs: cta  Cardiovascular: reg  Abd: soft, mild approp TTP, incisions ok  Extremities: no edema, +SCDs  Neuro: nonfocal, asleep  Lab Results: CBC  Recent Labs    08/08/17 1035 08/09/17 0513  WBC  --  10.8*  HGB 10.9* 10.4*  HCT 34.7* 33.3*  PLT  --  249   BMET Recent Labs    08/09/17 0513  NA 136  K 3.6  CL 107  CO2 25  GLUCOSE 140*  BUN 10  CREATININE 0.78  CALCIUM 8.4*   PT/INR No results for input(s): LABPROT, INR in the last 72 hours. ABG No results for input(s): PHART, HCO3 in the last 72 hours.  Invalid input(s): PCO2, PO2  Studies/Results:  Anti-infectives: Anti-infectives (From admission, onward)   Start     Dose/Rate Route Frequency Ordered Stop   08/08/17 0553  cefoTEtan in Dextrose 5% (CEFOTAN) IVPB 2 g     2 g Intravenous On call to O.R. 08/08/17 0553 08/08/17 0845      Medications: Scheduled Meds: . acetaminophen (TYLENOL) oral liquid 160 mg/5 mL  650 mg Oral Q6H  . enoxaparin (LOVENOX) injection  30 mg Subcutaneous Q12H  . gabapentin  200 mg Oral Q12H  . ketorolac  30 mg Intravenous Q8H  . pantoprazole (PROTONIX) IV  40 mg  Intravenous QHS  . protein supplement shake  2 oz Oral Q2H   Continuous Infusions: . dextrose 5 % and 0.45 % NaCl with KCl 20 mEq/L 1,000 mL (08/09/17 0547)   PRN Meds:.diphenhydrAMINE, morphine injection, ondansetron (ZOFRAN) IV, oxyCODONE, promethazine, simethicone  Assessment/Plan: Patient Active Problem List   Diagnosis Date Noted  . Morbid obesity (HCC) 08/08/2017  . Lumbosacral pain, chronic 08/08/2017  . Bilateral chronic knee pain 08/08/2017  . Hiatal hernia 08/08/2017  . Prediabetes 08/08/2017   s/p Procedure(s): LAPAROSCOPIC GASTRIC SLEEVE RESECTION WITH UPPER ENDO HERNIA REPAIR HIATAL 08/08/2017  Vitals and labs ok Exam reassuring But not sure if pt will meet oral intake goals Reassess later today Cont chemical vte prophylaxis Try 2 doses toradol  Disposition:  LOS: 1 day    Gaynelle AduEric Rayvon Dakin, MD [phone] [facility]

## 2017-08-09 NOTE — Progress Notes (Signed)
Patient alert and oriented, Post op day 1.  Provided support and encouragement.  Encouraged pulmonary toilet, ambulation and small sips of liquids.  Patient experienced nausea overnight and inability to urinate requiring I&O cath.  Since then voided independently.  Consumed 6 ounces of clear fluid.  Instructed to have HOB at 90 degrees or sit in chair when sipping fluids.  All questions answered.  Will continue to monitor.

## 2017-08-09 NOTE — Progress Notes (Signed)
   08/08/17 1900 08/09/17 0100  Urine Characteristics  Urine Color Yellow/straw --   Urine Appearance Clear --   Urine Odor No odor --   Urinary Interventions Bladder scan;Intermittent/Straight cath Bladder scan  Bladder Scan Volume (mL) 300 mL 259 mL  Intermittent/Straight Cath (mL) 300 mL 300 mL (per report)  Post Void Cath Residual (mL) 0 mL --

## 2017-08-09 NOTE — Plan of Care (Signed)
Nutrition Education Note  Received consult for diet education per DROP protocol.   Discussed 2 week post op diet with pt. Emphasized that liquids must be non carbonated, non caffeinated, and sugar free. Fluid goals discussed. Pt to follow up with outpatient bariatric RD for further diet progression after 2 weeks. Multivitamins and minerals also reviewed. Teach back method used, pt expressed understanding, expect good compliance.   Diet: First 2 Weeks  You will see the nutritionist about two (2) weeks after your surgery. The nutritionist will increase the types of foods you can eat if you are handling liquids well:  If you have severe vomiting or nausea and cannot handle clear liquids lasting longer than 1 day, call your surgeon  Protein Shake  Drink at least 2 ounces of shake 5-6 times per day  Each serving of protein shakes (usually 8 - 12 ounces) should have a minimum of:  15 grams of protein  And no more than 5 grams of carbohydrate  Goal for protein each day:  Men = 80 grams per day  Women = 60 grams per day  Protein powder may be added to fluids such as non-fat milk or Lactaid milk or Soy milk (limit to 35 grams added protein powder per serving)   Hydration  Slowly increase the amount of water and other clear liquids as tolerated (See Acceptable Fluids)  Slowly increase the amount of protein shake as tolerated  Sip fluids slowly and throughout the day  May use sugar substitutes in small amounts (no more than 6 - 8 packets per day; i.e. Splenda)   Fluid Goal  The first goal is to drink at least 8 ounces of protein shake/drink per day (or as directed by the nutritionist); some examples of protein shakes are Premier Protein, Syntrax Nectar, Adkins Advantage, EAS Edge HP, and Unjury. See handout from pre-op Bariatric Education Class:  Slowly increase the amount of protein shake you drink as tolerated  You may find it easier to slowly sip shakes throughout the day  It is important to  get your proteins in first  Your fluid goal is to drink 64 - 100 ounces of fluid daily  It may take a few weeks to build up to this  32 oz (or more) should be clear liquids  And  32 oz (or more) should be full liquids (see below for examples)  Liquids should not contain sugar, caffeine, or carbonation   Clear Liquids:  Water or Sugar-free flavored water (i.e. Fruit H2O, Propel)  Decaffeinated coffee or tea (sugar-free)  Crystal Lite, Wyler?s Lite, Minute Maid Lite  Sugar-free Jell-O  Bouillon or broth  Sugar-free Popsicle: *Less than 20 calories each; Limit 1 per day   Full Liquids:  Protein Shakes/Drinks + 2 choices per day of other full liquids  Full liquids must be:  No More Than 12 grams of Carbs per serving  No More Than 3 grams of Fat per serving  Strained low-fat cream soup  Non-Fat milk  Fat-free Lactaid Milk  Sugar-free yogurt (Dannon Lite & Fit, Greek yogurt, Oikos Zero)   Kashlyn Salinas RD, LDN Clinical Nutrition Pager # - [phone]   

## 2017-08-10 LAB — CBC WITH DIFFERENTIAL/PLATELET
BASOS ABS: 0 10*3/uL (ref 0.0–0.1)
Basophils Relative: 0 %
Eosinophils Absolute: 0.1 10*3/uL (ref 0.0–0.7)
Eosinophils Relative: 1 %
HEMATOCRIT: 31.1 % — AB (ref 36.0–46.0)
Hemoglobin: 9.8 g/dL — ABNORMAL LOW (ref 12.0–15.0)
LYMPHS PCT: 29 %
Lymphs Abs: 1.8 10*3/uL (ref 0.7–4.0)
MCH: 28 pg (ref 26.0–34.0)
MCHC: 31.5 g/dL (ref 30.0–36.0)
MCV: 88.9 fL (ref 78.0–100.0)
MONO ABS: 0.9 10*3/uL (ref 0.1–1.0)
Monocytes Relative: 15 %
NEUTROS ABS: 3.4 10*3/uL (ref 1.7–7.7)
Neutrophils Relative %: 55 %
Platelets: 232 10*3/uL (ref 150–400)
RBC: 3.5 MIL/uL — ABNORMAL LOW (ref 3.87–5.11)
RDW: 13.1 % (ref 11.5–15.5)
WBC: 6.1 10*3/uL (ref 4.0–10.5)

## 2017-08-10 NOTE — Progress Notes (Signed)
Patient alert and oriented, pain is controlled. Patient is tolerating fluids, advanced to protein shake today, patient is tolerating well.  Reviewed Gastric sleeve discharge instructions with patient and patient is able to articulate understanding.  Provided information on BELT program, Support Group and [facility] outpatient pharmacy. All questions answered, will continue to monitor.  

## 2017-08-10 NOTE — Progress Notes (Signed)
Patient alert and oriented, Post op day 2.  Provided support and encouragement.  Encouraged pulmonary toilet, ambulation and small sips of liquids.  All questions answered.  Will continue to monitor. 

## 2017-08-10 NOTE — Progress Notes (Signed)
Patient complains of pain on her chest and throat Vitals signs as follows BP- 104/62 Pulse- 64 Temp.- 98.8 RR- 18 O2 sat- 97% on Room air  Patient encouraged to walk and assisted in the hallway. PRN simethicone was given.  We will continue to monitor

## 2017-08-15 ENCOUNTER — Telehealth (HOSPITAL_COMMUNITY): Payer: Self-pay

## 2017-08-15 NOTE — Telephone Encounter (Signed)
Patient called to discuss post bariatric surgery follow up questions.  See below:   1.  Tell me about your pain and pain management?has not needed to take pain medication  2.  Let's talk about fluid intake.  How much total fluid are you taking in?taking in roughly 45 ounces of fluid including (shake, broth, water, natures twist).  Patient stated she feels full quickly.  She was taking large swallows of fluid.  Counseled patient to take smaller more frequent sip versus a large amount at one time to reduce full feeling.  3.  How much protein have you taken in the last 2 days?around 45 grams of protein.  Counseled to increase the amount of protein gradually  4.  Have you had nausea?  Tell me about when have experienced nausea and what you did to help?reports no nausea  5.  Has the frequency or color changed with your urine?urine light in color and states uses the bathroom alot  6.  Tell me what your incisions look like?look good  7.  Have you been passing gas? BM?3 BMS since surgery without difficulty  8.  If a problem or question were to arise who would you call?  Do you know contact numbers for [facility], [facility], and [facility]?yes is aware of numbers and who to call with any concerns  9.  How has the walking going?walking regularly, patient at hospital with son who has been very ill  10.  How are your vitamins and calcium going?  How are you taking them?no problems with vitamins and calcium.

## 2017-08-15 NOTE — Discharge Summary (Signed)
Physician Discharge Summary  Shelby Mosley YFV:[id number] DOB: Apr 10, 1971 DOA: 08/08/2017  PCP: Raelyn Number, MD  Admit date: 08/08/2017 Discharge date: 08/10/2017  Recommendations for Outpatient Follow-up:   Follow-up Information    Greer Pickerel, MD. Go on 08/24/2017.   Specialty:  General Surgery Why:  at [address] information: [address] [place] [address] [phone]        Greer Pickerel, MD Follow up.   Specialty:  General Surgery Contact information: [place] [address] [phone]          Discharge Diagnoses:  Principal Problem:   Morbid obesity ([place]) Active Problems:   Lumbosacral pain, chronic   Bilateral chronic knee pain   Hiatal hernia   Prediabetes   Surgical Procedure: Laparoscopic Sleeve Gastrectomy with hiatal hernia repair, upper endoscopy  Discharge Condition: Good Disposition: Home  Diet recommendation: Postoperative sleeve gastrectomy diet (liquids only)  Filed Weights   08/08/17 0601  Weight: 117.8 kg (259 lb 9.6 oz)     Hospital Course:  The patient was admitted for a planned laparoscopic sleeve gastrectomy. Please see operative note. Preoperatively the patient was given 5000 units of subcutaneous heparin for DVT prophylaxis. Postoperative prophylactic Lovenox dosing was started on the evening of postoperative day 0. ERAS protocol was used. On the evening of postoperative day 0, the patient was started on water and ice chips. On postoperative day 1 the patient had no fever or tachycardia and was tolerating water in their diet was gradually advanced throughout the day. However she did not meet criteria for discharge due to poor oral intake. On POD 2 The patient was ambulating without difficulty. Their vital signs are stable without fever or tachycardia. Their hemoglobin had remained stable. Her oral intake had improved. The patient had received discharge instructions and counseling. They were deemed  stable for discharge and had met discharge criteria   Discharge Instructions  Discharge Instructions    Ambulate hourly while awake   Complete by:  As directed    Call MD for:  difficulty breathing, headache or visual disturbances   Complete by:  As directed    Call MD for:  persistant dizziness or light-headedness   Complete by:  As directed    Call MD for:  persistant nausea and vomiting   Complete by:  As directed    Call MD for:  redness, tenderness, or signs of infection (pain, swelling, redness, odor or green/yellow discharge around incision site)   Complete by:  As directed    Call MD for:  severe uncontrolled pain   Complete by:  As directed    Call MD for:  temperature >101 F   Complete by:  As directed    Diet bariatric full liquid   Complete by:  As directed    Discharge instructions   Complete by:  As directed    See bariatric discharge instructions   Incentive spirometry   Complete by:  As directed    Perform hourly while awake     Allergies as of 08/10/2017   No Known Allergies     Medication List    TAKE these medications   BARIATRIC MULTIVITAMINS/IRON PO Take 1 tablet by mouth daily.   calcium carbonate 750 MG chewable tablet Commonly known as:  TUMS EX Chew 1 tablet by mouth 3 (three) times daily.      Follow-up Information    Greer Pickerel, MD. Go on 08/24/2017.   Specialty:  General  Surgery Why:  at [address] information: [address] [place] [address] [phone]        Greer Pickerel, MD Follow up.   Specialty:  General Surgery Contact information: [place]  [address] [phone]            The results of significant diagnostics from this hospitalization (including imaging, microbiology, ancillary and laboratory) are listed below for reference.    Significant Diagnostic Studies: No results found.  Labs: Basic Metabolic Panel: Recent Labs  Lab 08/09/17 0513  NA 136  K 3.6  CL  107  CO2 25  GLUCOSE 140*  BUN 10  CREATININE 0.78  CALCIUM 8.4*   Liver Function Tests: Recent Labs  Lab 08/09/17 0513  AST 22  ALT 18  ALKPHOS 60  BILITOT 0.4  PROT 6.7  ALBUMIN 3.0*    CBC: Recent Labs  Lab 08/08/17 1035 08/09/17 0513 08/10/17 0445  WBC  --  10.8* 6.1  NEUTROABS  --  8.3* 3.4  HGB 10.9* 10.4* 9.8*  HCT 34.7* 33.3* 31.1*  MCV  --  88.1 88.9  PLT  --  249 232    CBG: No results for input(s): GLUCAP in the last 168 hours.  Principal Problem:   Morbid obesity ([place]) Active Problems:   Lumbosacral pain, chronic   Bilateral chronic knee pain   Hiatal hernia   Prediabetes   Time coordinating discharge: 15 min  Signed:  Gayland Curry, MD [facility], [place] [phone] 08/15/2017, 12:16 AM

## 2017-08-16 ENCOUNTER — Other Ambulatory Visit: Payer: Self-pay

## 2017-08-16 DIAGNOSIS — I83893 Varicose veins of bilateral lower extremities with other complications: Secondary | ICD-10-CM

## 2017-08-23 ENCOUNTER — Ambulatory Visit: Payer: Managed Care, Other (non HMO) | Admitting: Registered"

## 2017-08-30 ENCOUNTER — Ambulatory Visit: Payer: Managed Care, Other (non HMO) | Admitting: Skilled Nursing Facility1

## 2017-09-07 ENCOUNTER — Encounter (HOSPITAL_COMMUNITY): Payer: Managed Care, Other (non HMO)

## 2017-09-07 ENCOUNTER — Encounter: Payer: Managed Care, Other (non HMO) | Admitting: Vascular Surgery

## 2017-09-15 ENCOUNTER — Telehealth: Payer: Self-pay | Admitting: Skilled Nursing Facility1

## 2017-09-15 NOTE — Telephone Encounter (Signed)
Pt has not had any follow up since surgery so Dietitian called to make her an appointment.  Pt answered stating she was still on her pureed phase, Dietitian advised there is no pureed phase and it would be best for her to come in so she could get the appropriate diet plan.   Pt states [facility] needs to call her tomorrow to schedule the appointment.

## 2018-07-06 ENCOUNTER — Ambulatory Visit: Payer: Self-pay

## 2018-07-06 ENCOUNTER — Other Ambulatory Visit: Payer: Self-pay | Admitting: Occupational Medicine

## 2018-07-06 DIAGNOSIS — M79671 Pain in right foot: Secondary | ICD-10-CM

## 2019-03-27 ENCOUNTER — Encounter (HOSPITAL_COMMUNITY): Payer: Self-pay

## 2019-11-09 IMAGING — DX DG FOOT COMPLETE 3+V*R*
3 series · 3 of 3 positions shown · non-contrast
Comparison: None.

CLINICAL DATA: Right foot pain

EXAM:
RIGHT FOOT COMPLETE - 3+ VIEW

[foot ap]
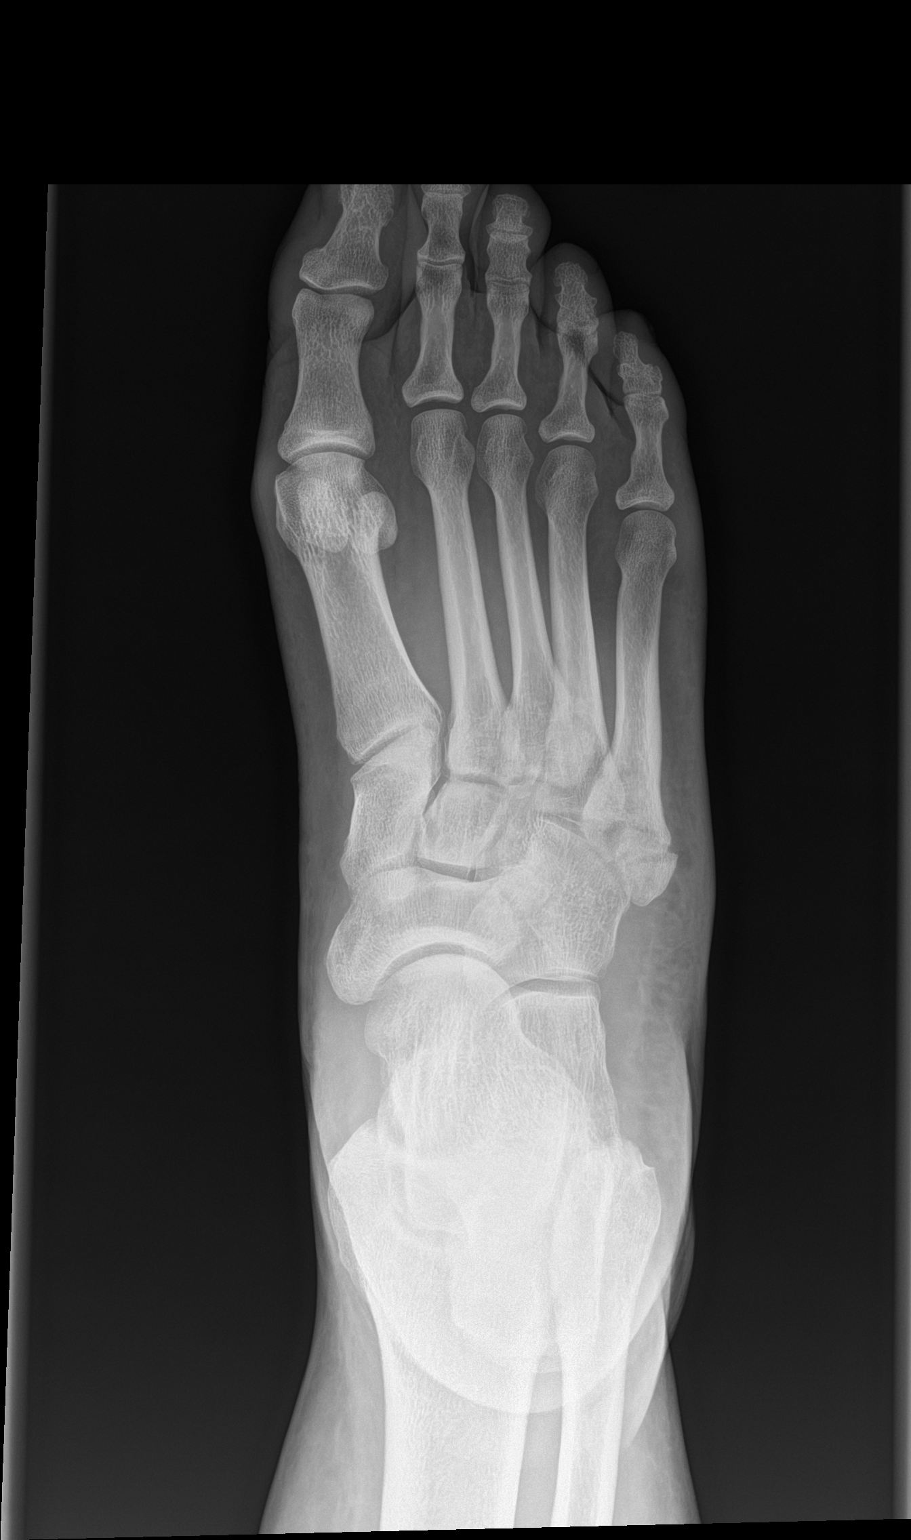

[foot obl]
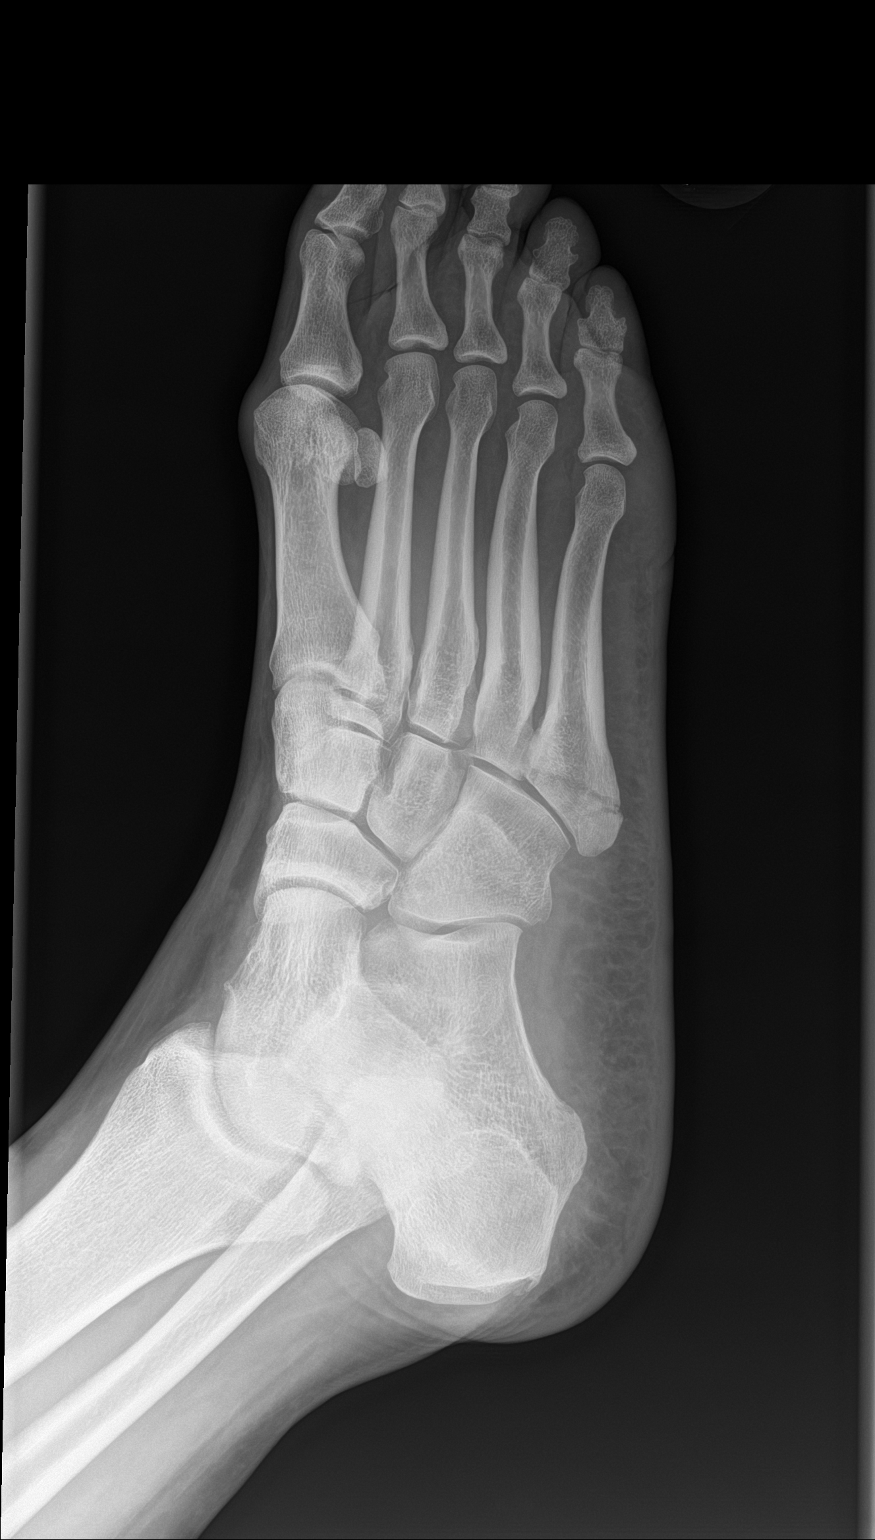

[foot lat]
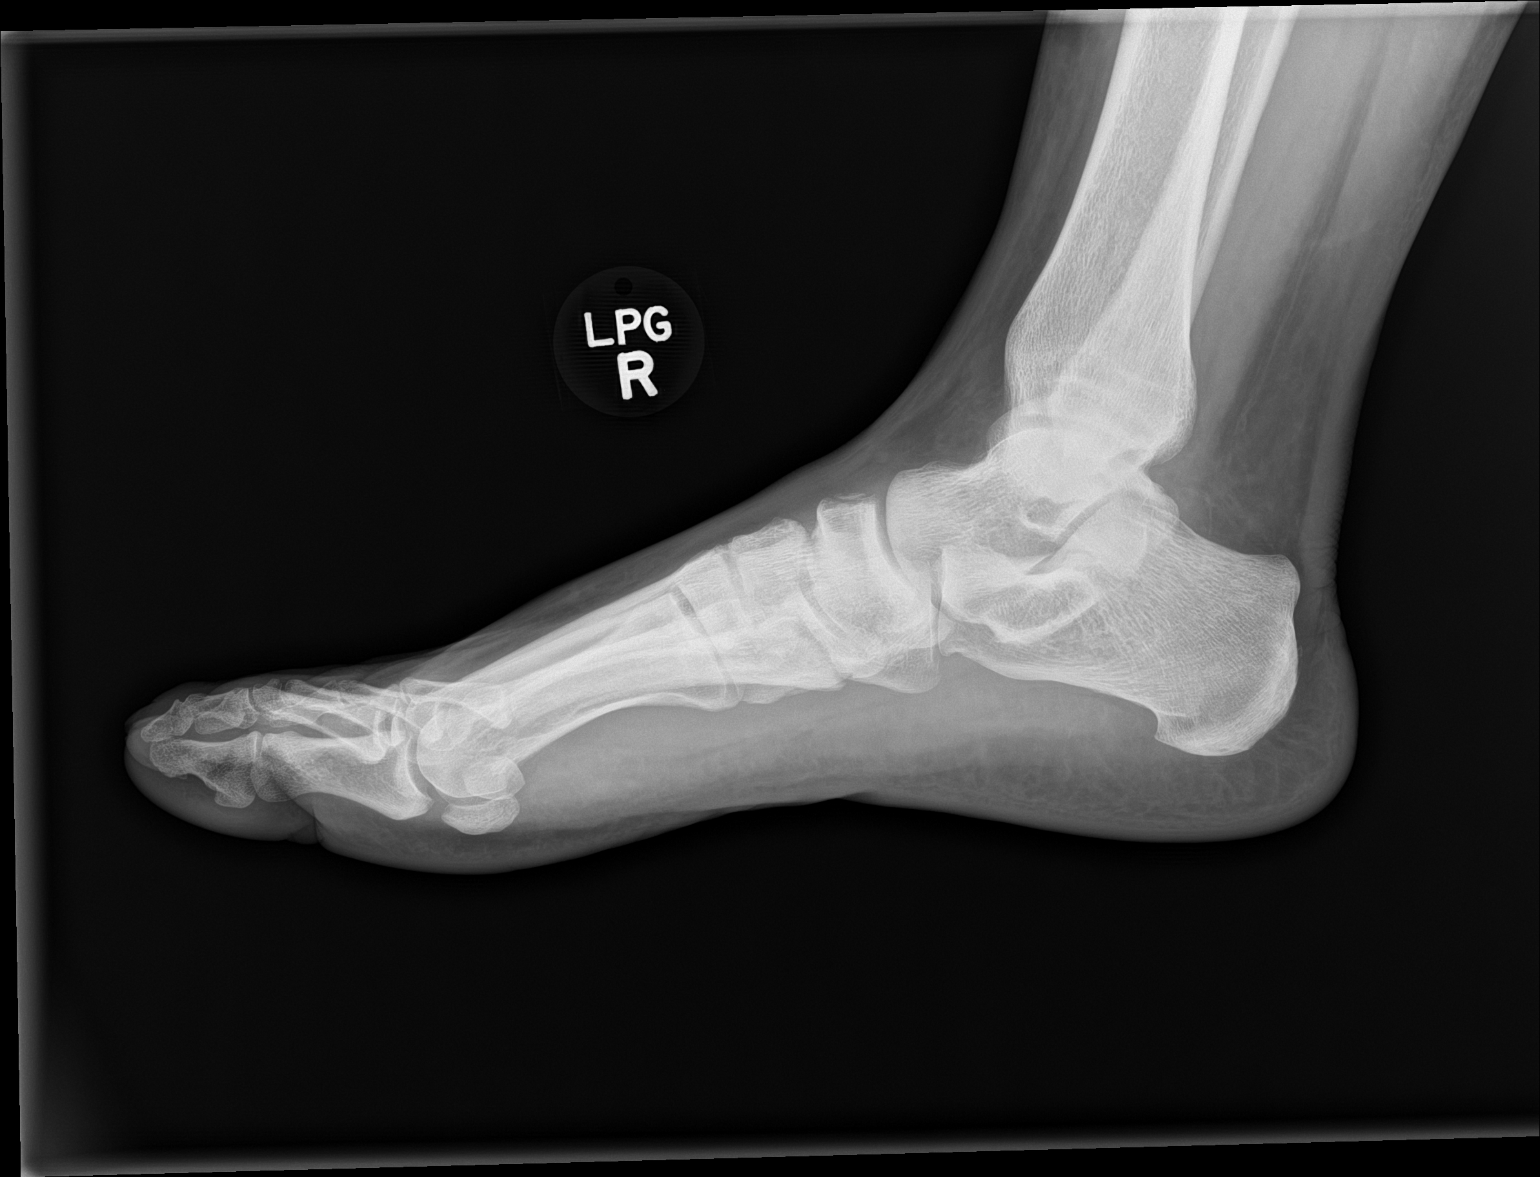

[3 of 3 positions shown; findings below may reference images not displayed]

FINDINGS: Acute fracture through the base of the fifth metatarsal with mild
lateral displacement (2 mm). There is regional soft tissue swelling.

Bony crescent along the dorsal navicular on the lateral view that
could be spurring or avulsion fracture.
IMPRESSION: 1. Fifth metatarsal base fracture with mild displacement.
2. Flake of bone dorsal to the navicular which could be spurring or
fracture lucency.

## 2020-02-28 ENCOUNTER — Encounter (HOSPITAL_COMMUNITY): Payer: Self-pay

## 2020-09-26 ENCOUNTER — Other Ambulatory Visit: Payer: Self-pay | Admitting: Internal Medicine

## 2020-09-26 DIAGNOSIS — Z1231 Encounter for screening mammogram for malignant neoplasm of breast: Secondary | ICD-10-CM

## 2021-03-04 ENCOUNTER — Encounter (HOSPITAL_COMMUNITY): Payer: Self-pay | Admitting: *Deleted

## 2022-02-25 ENCOUNTER — Encounter (HOSPITAL_COMMUNITY): Payer: Self-pay | Admitting: *Deleted

## 2023-02-25 ENCOUNTER — Encounter (HOSPITAL_COMMUNITY): Payer: Self-pay | Admitting: *Deleted

## 2024-01-26 ENCOUNTER — Other Ambulatory Visit: Payer: Self-pay

## 2024-01-26 DIAGNOSIS — R102 Pelvic and perineal pain: Secondary | ICD-10-CM

## 2024-01-27 ENCOUNTER — Other Ambulatory Visit

## 2024-01-30 ENCOUNTER — Other Ambulatory Visit: Payer: Self-pay

## 2024-01-30 ENCOUNTER — Ambulatory Visit
Admission: RE | Admit: 2024-01-30 | Discharge: 2024-01-30 | Disposition: A | Discharge status: Home / Self Care | Source: Ambulatory Visit

## 2024-01-30 DIAGNOSIS — N898 Other specified noninflammatory disorders of vagina: Secondary | ICD-10-CM

## 2024-01-30 DIAGNOSIS — R102 Pelvic and perineal pain: Secondary | ICD-10-CM

## 2024-01-30 DIAGNOSIS — Z1231 Encounter for screening mammogram for malignant neoplasm of breast: Secondary | ICD-10-CM

## 2024-02-03 ENCOUNTER — Ambulatory Visit
Admission: RE | Admit: 2024-02-03 | Discharge: 2024-02-03 | Disposition: A | Discharge status: Home / Self Care | Source: Ambulatory Visit

## 2024-02-03 DIAGNOSIS — Z1231 Encounter for screening mammogram for malignant neoplasm of breast: Secondary | ICD-10-CM
